# Patient Record
Sex: Female | Born: 1955 | Race: White | Hispanic: No | Marital: Married | Smoking: Former smoker
Health system: Southern US, Community
[De-identification: ages and names within clinical notes are randomized; demographics above are authoritative.]

## PROBLEM LIST (undated history)

## (undated) DIAGNOSIS — I82409 Acute embolism and thrombosis of unspecified deep veins of unspecified lower extremity: Secondary | ICD-10-CM

## (undated) DIAGNOSIS — Z9109 Other allergy status, other than to drugs and biological substances: Secondary | ICD-10-CM

## (undated) DIAGNOSIS — E119 Type 2 diabetes mellitus without complications: Secondary | ICD-10-CM

## (undated) DIAGNOSIS — I10 Essential (primary) hypertension: Secondary | ICD-10-CM

## (undated) DIAGNOSIS — J449 Chronic obstructive pulmonary disease, unspecified: Secondary | ICD-10-CM

## (undated) DIAGNOSIS — Z86718 Personal history of other venous thrombosis and embolism: Secondary | ICD-10-CM

## (undated) HISTORY — DX: Personal history of other venous thrombosis and embolism: Z86.718

## (undated) HISTORY — DX: Chronic obstructive pulmonary disease, unspecified: J44.9

## (undated) HISTORY — DX: Essential (primary) hypertension: I10

## (undated) HISTORY — DX: Type 2 diabetes mellitus without complications: E11.9

## (undated) HISTORY — DX: Acute embolism and thrombosis of unspecified deep veins of unspecified lower extremity: I82.409

## (undated) HISTORY — DX: Other allergy status, other than to drugs and biological substances: Z91.09

---

## 2012-05-25 DIAGNOSIS — I2699 Other pulmonary embolism without acute cor pulmonale: Secondary | ICD-10-CM

## 2012-05-25 HISTORY — DX: Other pulmonary embolism without acute cor pulmonale: I26.99

## 2012-10-24 ENCOUNTER — Institutional Professional Consult (permissible substitution): Payer: Self-pay | Admitting: Critical Care Medicine

## 2013-01-05 ENCOUNTER — Institutional Professional Consult (permissible substitution): Payer: Self-pay | Admitting: Internal Medicine

## 2013-01-08 ENCOUNTER — Telehealth: Payer: Self-pay | Admitting: Internal Medicine

## 2013-01-08 NOTE — Telephone Encounter (Signed)
Katie, pls advise if pt can be worked in.  Thank you.

## 2013-01-08 NOTE — Telephone Encounter (Signed)
Pt can come in on 01-18-13 at 11 am for 11:15am consult with CY.

## 2013-01-08 NOTE — Telephone Encounter (Signed)
Spoke with pt and scheduled appt with CDY for 2/27 at 11 am for 11:15 consult Pt states nothing further needed

## 2013-01-18 ENCOUNTER — Other Ambulatory Visit: Payer: BC Managed Care – PPO

## 2013-01-18 ENCOUNTER — Encounter: Payer: Self-pay | Admitting: Internal Medicine

## 2013-01-18 ENCOUNTER — Ambulatory Visit (INDEPENDENT_AMBULATORY_CARE_PROVIDER_SITE_OTHER)
Admission: RE | Admit: 2013-01-18 | Discharge: 2013-01-18 | Disposition: A | Discharge status: Home / Self Care | Payer: BC Managed Care – PPO | Source: Ambulatory Visit | Attending: Internal Medicine | Admitting: Internal Medicine

## 2013-01-18 ENCOUNTER — Ambulatory Visit (INDEPENDENT_AMBULATORY_CARE_PROVIDER_SITE_OTHER): Payer: BC Managed Care – PPO | Admitting: Internal Medicine

## 2013-01-18 VITALS — BP 122/64 | HR 72 | Ht 66.0 in | Wt 253.2 lb
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
## 2013-01-18 NOTE — Patient Instructions (Addendum)
Order- CXR  Dx  Bronchitis  Order- lab- Allergy profile     Dx allergic rhinitis  We will contact [facility] for a copy of your PFT results from 2013  Sample Dymista nasal spray    1-2 puffs each nostril, once daily at bedtime

## 2013-01-19 DIAGNOSIS — J449 Chronic obstructive pulmonary disease, unspecified: Secondary | ICD-10-CM | POA: Insufficient documentation

## 2013-01-19 DIAGNOSIS — J4489 Other specified chronic obstructive pulmonary disease: Secondary | ICD-10-CM | POA: Insufficient documentation

## 2013-01-19 LAB — ALLERGY FULL PROFILE
Allergen, D pternoyssinus,d7: <0.1 kU/L
Allergen,Goose feathers, e70: <0.1 kU/L
Alternaria Alternata: <0.1 kU/L
Aspergillus fumigatus, m3: <0.1 kU/L
Bahia Grass: <0.1 kU/L
Box Elder IgE: <0.1 kU/L
Cat Dander: <0.1 kU/L
Common Ragweed: <0.1 kU/L
D. farinae: <0.1 kU/L
Dog Dander: <0.1 kU/L
Goldenrod: <0.1 kU/L
Helminthosporium halodes: <0.1 kU/L
House Dust Hollister: <0.1 kU/L
IgE (Immunoglobulin E), Serum: 155 IU/mL (ref 0.0–180.0)
Plantain: <0.1 kU/L
Stemphylium Botryosum: <0.1 kU/L
Sycamore Tree: <0.1 kU/L

## 2013-01-19 NOTE — Assessment & Plan Note (Signed)
Plan-sample Dymista, allergy profile

## 2013-01-22 NOTE — Progress Notes (Signed)
Quick Note:  Pt aware of results. ______ 

## 2013-02-13 ENCOUNTER — Institutional Professional Consult (permissible substitution): Payer: Self-pay | Admitting: Internal Medicine

## 2013-02-20 ENCOUNTER — Ambulatory Visit (INDEPENDENT_AMBULATORY_CARE_PROVIDER_SITE_OTHER): Payer: BC Managed Care – PPO | Admitting: Internal Medicine

## 2013-02-20 ENCOUNTER — Encounter: Payer: Self-pay | Admitting: Internal Medicine

## 2013-02-20 VITALS — BP 120/80 | HR 78 | Ht 63.0 in | Wt 248.2 lb

## 2013-02-20 DIAGNOSIS — J309 Allergic rhinitis, unspecified: Secondary | ICD-10-CM

## 2013-02-20 DIAGNOSIS — J441 Chronic obstructive pulmonary disease with (acute) exacerbation: Secondary | ICD-10-CM

## 2013-02-20 DIAGNOSIS — J302 Other seasonal allergic rhinitis: Secondary | ICD-10-CM

## 2013-02-20 DIAGNOSIS — J449 Chronic obstructive pulmonary disease, unspecified: Secondary | ICD-10-CM

## 2013-02-20 MED ORDER — PHENYLEPHRINE HCL 1 % NA SOLN
3.0000 [drp] | Freq: Once | NASAL | Status: AC
  Administered 2013-02-20: 3 [drp] via NASAL

## 2013-02-20 MED ORDER — METHYLPREDNISOLONE ACETATE 80 MG/ML IJ SUSP
80.0000 mg | Freq: Once | INTRAMUSCULAR | Status: AC
  Administered 2013-02-20: 80 mg via INTRAMUSCULAR

## 2013-02-20 MED ORDER — DESLORATADINE 5 MG PO TABS: 5.0000 mg | ORAL_TABLET | Freq: Every day | ORAL | Status: DC

## 2013-02-20 NOTE — Patient Instructions (Addendum)
Neb neo nasal  Depo 80  Order- schedule PFT  Dx COPD  Try Afrin otc nasal spray  1-2 puffs each nostril twice daily x 3 days only  Sample Omnaris  Nasal spray. Each time you use Afrin, as soon as your nose opens, then use the [facility] to try again with the claritin/loratadine  If Claritin doesn't help, then try script for Clarinex

## 2013-02-20 NOTE — Progress Notes (Signed)
01/18/13- 57 yo F former 2 ppd smoker referred courtesy of  Conroy(Nathan)-cough  Here with friend. She was told in 2013 that she had COPD based on PFT at [facility]. Some history of seasonal allergic rhinitis. [place] with animal and hay exposure, pet cats. She complains of burning throat and cough for the past year intermittently. Only occasionally coughs up some clear phlegm. No specific trigger, location or exposure. Generic Claritin helped for one year and then stopped helping. Other antihistamines did not help. Inhalers no help. Mainly throat discomfort with postnasal drip. Few lower respiratory complaints. Advair helped for a while to reduce wheeze but then seemed to stop working. GERD controlled with Nexium. Daughter has asthma and father had asthma.  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 Dymista nasal spray no help, tried on 2 different occasions. Claritin used to work when she switched to [employer] and Allegra-D. She has not tried Claritin again but thinks he worked better. 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 PFT at [facility] 09/20/12- moderate obstructive airways disease. Insignificant response to bronchodilator. FVC 3.19/73%, FEV1 2.59/57%, FEV1/FVC 0.80, FEF 25-75% 2.69/29%. Measured lung volumes and diffusion were not done. She says she was sick that day. CXR 01/22/13 IMPRESSION:  No active cardiopulmonary disease.  Original Report Authenticated By: Charlett Nose, M.D.  ROS-see HPI Constitutional:   No-   weight loss, night sweats, fevers, chills, fatigue, lassitude. HEENT:   No-  headaches, difficulty swallowing, tooth/dental problems, sore throat,       +sneezing, itching, ear ache, nasal congestion, post nasal drip,  CV:  No-   chest pain, orthopnea, PND, swelling in lower extremities, anasarca,                                  dizziness,  palpitations Resp: +  shortness of breath with exertion or at rest.              +  productive cough,  + non-productive cough,  No- coughing up of blood.              No-   change in color of mucus.  + wheezing.   Skin: No-   rash or lesions. GI:  No-   heartburn, indigestion, abdominal pain, nausea, vomiting, GU:  MS:  No-   joint pain or swelling.   Neuro-     nothing unusual Psych:  No- change in mood or affect. No depression or anxiety.  No memory loss.  OBJ- Physical Exam General- Alert, Oriented, Affect-appropriate, Distress- none acute. Obese Skin- rash-none, lesions- none, excoriation- none Lymphadenopathy- question small anterior cervical nodes Head- atraumatic            Eyes- Gross vision intact, PERRLA, conjunctivae and secretions clear            Ears- Hearing, canals-normal            Nose- + obvious nasal congestion+turbinate edema, no-Septal dev, mucus, polyps, erosion, perforation             Throat- Mallampati II , mucosa clear , drainage- none, tonsils- atrophic Neck- flexible , trachea midline, no stridor , thyroid nl, carotid no bruit Chest - symmetrical excursion , unlabored           Heart/CV-  RRR , no murmur , no gallop  , no rub, nl s1 s2                           - JVD- none , edema-+ 1/ L>R, stasis changes- none, varices- none           Lung- clear to P&A, wheeze- none, cough- none , dullness-none, rub- none           Chest wall-  Abd-  Br/ Gen/ Rectal- Not done, not indicated Extrem- cyanosis- none, clubbing, none, atrophy- none, strength- nl Neuro- grossly intact to observation

## 2013-02-28 NOTE — Assessment & Plan Note (Signed)
She indicates the PFTs in [facility] may not have been representative but she has a significant remote smoking history. Plan-update PFT to include measured lung volumes and diffusion capacity.

## 2013-02-28 NOTE — Assessment & Plan Note (Addendum)
Rhinitis is the most obvious finding on physical exam at this visit. Allergic and/or vasomotor. Plan-for 3 days only, to use Afrin nasal spray once or twice daily,start sample of Omnaris nasal spray, retry Claritin but given comparison prescription for Clarinex at her request.

## 2013-04-12 ENCOUNTER — Ambulatory Visit (INDEPENDENT_AMBULATORY_CARE_PROVIDER_SITE_OTHER): Payer: BC Managed Care – PPO | Admitting: Internal Medicine

## 2013-04-12 ENCOUNTER — Encounter: Payer: Self-pay | Admitting: Internal Medicine

## 2013-04-12 VITALS — BP 120/80 | HR 80 | Ht 64.0 in | Wt 242.0 lb

## 2013-04-12 DIAGNOSIS — J302 Other seasonal allergic rhinitis: Secondary | ICD-10-CM

## 2013-04-12 DIAGNOSIS — J449 Chronic obstructive pulmonary disease, unspecified: Secondary | ICD-10-CM

## 2013-04-12 DIAGNOSIS — R0602 Shortness of breath: Secondary | ICD-10-CM

## 2013-04-12 DIAGNOSIS — H6980 Other specified disorders of Eustachian tube, unspecified ear: Secondary | ICD-10-CM

## 2013-04-12 DIAGNOSIS — J3089 Other allergic rhinitis: Secondary | ICD-10-CM

## 2013-04-12 DIAGNOSIS — J309 Allergic rhinitis, unspecified: Secondary | ICD-10-CM

## 2013-04-12 DIAGNOSIS — J029 Acute pharyngitis, unspecified: Secondary | ICD-10-CM

## 2013-04-12 DIAGNOSIS — J4489 Other specified chronic obstructive pulmonary disease: Secondary | ICD-10-CM

## 2013-04-12 DIAGNOSIS — H6990 Unspecified Eustachian tube disorder, unspecified ear: Secondary | ICD-10-CM

## 2013-04-12 LAB — PULMONARY FUNCTION TEST

## 2013-04-12 NOTE — Progress Notes (Signed)
PFT done today. 

## 2013-04-12 NOTE — Progress Notes (Signed)
01/18/13- 57 yo F former 2 ppd smoker referred courtesy of  Conroy(Nathan)-cough  Here with friend. She was told in 2013 that she had COPD based on PFT at [facility]. Some history of seasonal allergic rhinitis. [place] with animal and hay exposure, pet cats. She complains of burning throat and cough for the past year intermittently. Only occasionally coughs up some clear phlegm. No specific trigger, location or exposure. Generic Claritin helped for one year and then stopped helping. Other antihistamines did not help. Inhalers no help. Mainly throat discomfort with postnasal drip. Few lower respiratory complaints. Advair helped for a while to reduce wheeze but then seemed to stop working. GERD controlled with Nexium. Daughter has asthma and father had asthma.  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 Dymista nasal spray no help, tried on 2 different occasions. Claritin used to work when she switched to [employer] and Allegra-D. She has not tried Claritin again but thinks he worked better. 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 PFT at [facility] 09/20/12- moderate obstructive airways disease. Insignificant response to bronchodilator. FVC 3.19/73%, FEV1 2.59/57%, FEV1/FVC 0.80, FEF 25-75% 2.69/29%. Measured lung volumes and diffusion were not done. She says she was sick that day. CXR 01/22/13 IMPRESSION:  No active cardiopulmonary disease.  Original Report Authenticated By: Charlett Nose, M.D.  04/12/13- 59 yo F former 2 ppd smoker referred courtesy of  Lonie Peak -cough   Sister here FOLLOWS FOR: review PFt with patient. Loratadine has not helped. On Aricept sample did not help. Depo-Medrol was no help for her rhinitis. Nose feels clear today Denies heartburn or acid indigestion on Nexium. She does complain that throat burns. Cough starts with a burning sensation in  the area of her left tonsil, after which she coughs out a plug from her tonsil. No cough unless her throat stings.. Has not used Advair in a long time. Remains on propranolol but taken off of lisinopril. PFT: 04/12/2013-moderate obstructive airways disease with response to bronchodilator, normal lung volumes, diffusion mildly reduced. FVC 2.7 tooth/80%, FEV1 1.86/70%, FEV1/FVC 0.68/86%,  FEF 25-75% 1.39/55%. TLC 99%, DLCO 69%.  ROS-see HPI Constitutional:   No-   weight loss, night sweats, fevers, chills, fatigue, lassitude. HEENT:   No-  headaches, difficulty swallowing, tooth/dental problems, +sore throat,       +sneezing, itching, ear ache, nasal congestion, post nasal drip,  CV:  No-   chest pain, orthopnea, PND, swelling in lower extremities, anasarca,                                  dizziness, palpitations Resp: +  shortness of breath with exertion or at rest.              +  productive cough,  + non-productive cough,  No- coughing up of blood.              No-   change in color of mucus.  + wheezing.   Skin: No-   rash or lesions. GI:  No-   heartburn, indigestion, abdominal pain, nausea, vomiting, GU:  MS:  No-   joint pain or swelling.   Neuro-     nothing unusual Psych:  No- change in mood or affect. No depression or anxiety.  No memory loss.  OBJ- Physical Exam General- Alert, Oriented, Affect-appropriate, Distress- none acute. Obese Skin- rash-none, lesions- none, excoriation- none Lymphadenopathy- question small anterior cervical nodes Head- atraumatic            Eyes- Gross vision intact, PERRLA, conjunctivae and secretions clear            Ears- Hearing, canals-normal            Nose- + obvious nasal congestion+turbinate edema, no-Septal dev, mucus, polyps, erosion, perforation             Throat- Mallampati IIIof , mucosa clear , drainage- none, tonsils- atrophic Neck- flexible , trachea midline, no stridor , thyroid nl, carotid no bruit Chest - symmetrical excursion ,  unlabored           Heart/CV- RRR , no murmur , no gallop  , no rub, nl s1 s2                           - JVD- none , edema-+ 1/ L>R, stasis changes- none, varices- none           Lung- clear to P&A, wheeze- none, cough- none , dullness-none, rub- none           Chest wall-  Abd-  Br/ Gen/ Rectal- Not done, not indicated Extrem- cyanosis- none, clubbing, none, atrophy- none, strength- nl Neuro- grossly intact to observation

## 2013-04-12 NOTE — Patient Instructions (Addendum)
Refer to ENT Dr Haroldine Laws      Dx eustachian dysfunction, pharyngitis

## 2013-04-24 NOTE — Assessment & Plan Note (Signed)
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 We can consider a trial of maintenance bronchodilator later.

## 2013-04-24 NOTE — Assessment & Plan Note (Signed)
Recurrent eustachian dysfunction. Coughing retained  plugs from tonsil crypts. Plan-I offered ENT referral for consideration of her eustachian dysfunction and for complaints about her tonsils

## 2013-04-30 ENCOUNTER — Encounter: Payer: Self-pay | Admitting: Internal Medicine

## 2013-05-03 ENCOUNTER — Telehealth: Payer: Self-pay | Admitting: Internal Medicine

## 2013-05-03 NOTE — Telephone Encounter (Signed)
Per CY-we can manage her allergy problems; If she would like an opinion from elsewhere that is always fine.

## 2013-05-03 NOTE — Telephone Encounter (Signed)
I spoke with pt and appt scheduled. Nothing further was needed 

## 2013-05-03 NOTE — Telephone Encounter (Signed)
Pt aware and was transferred upfront to schedule an appt. Nothing further was needed

## 2013-05-03 NOTE — Telephone Encounter (Signed)
Friday 05-18-13 at 11:00am. Thanks

## 2013-05-03 NOTE — Telephone Encounter (Signed)
I spoke with pt. She stated she saw Dr. Haroldine Randall as Dr. Maple Randall referred her to him. She stated she had CT sinus scan and it was fine. ENT referred her to an allergists and she is confused bc Dr. Maple Randall is an allergists. Dr. Haroldine Randall referred her to [facility] for possible allergies/allergy testing. She is wanting to know how Dr. Maple Randall feels about this. Please advise thanks  No pending appt.

## 2013-05-03 NOTE — Telephone Encounter (Signed)
Called spoke with patient, verified message above.   Pt requesting to be seen within the next 2-3 weeks CY with no openings - pt does not want to see TP Katie/CY please advise of work-in appt, thank you.

## 2013-05-03 NOTE — Telephone Encounter (Signed)
(  continued) ...replied, "No".  Pt would like to know why she was referred to ENT when the ENT is advising that pt needs to see an allergy specialist (referred pt to an allergist).  Pt is confused and asks to speak w/ CY's nurse.  Antionette Fairy

## 2013-05-18 ENCOUNTER — Ambulatory Visit: Payer: BC Managed Care – PPO | Admitting: Internal Medicine

## 2013-12-03 ENCOUNTER — Telehealth: Payer: Self-pay | Admitting: Internal Medicine

## 2013-12-03 NOTE — Telephone Encounter (Signed)
Pt states that she will not be making any future appts w/ CY at this time.  Antionette FairyHolly D Pryor

## 2016-11-05 ENCOUNTER — Other Ambulatory Visit: Payer: Self-pay | Admitting: Physician Assistant

## 2016-11-05 DIAGNOSIS — M5416 Radiculopathy, lumbar region: Secondary | ICD-10-CM

## 2016-11-16 ENCOUNTER — Ambulatory Visit
Admission: RE | Admit: 2016-11-16 | Discharge: 2016-11-16 | Disposition: A | Discharge status: Home / Self Care | Payer: BLUE CROSS/BLUE SHIELD | Source: Ambulatory Visit | Attending: Physician Assistant | Admitting: Physician Assistant

## 2016-11-16 DIAGNOSIS — M5416 Radiculopathy, lumbar region: Secondary | ICD-10-CM

## 2018-08-05 IMAGING — MR MR LUMBAR SPINE W/O CM
4 of 5 series · 18 of 48 positions shown · non-contrast
Comparison: Radiographs 10/09/2016.

CLINICAL DATA: Severe left buttock and posterior left leg pain for
3 months. No known injury or prior relevant surgery.

EXAM:
MRI LUMBAR SPINE WITHOUT CONTRAST
TECHNIQUE: Multiplanar, multisequence MR imaging of the lumbar spine was
performed. No intravenous contrast was administered.

[Series 6: T2 · sagittal · 4.0mm · 0.68mm/px · 6 of 15 slices shown (1 of 2)]
[im 1/15]
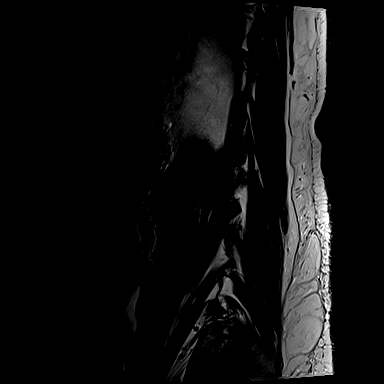
[im 3/15]
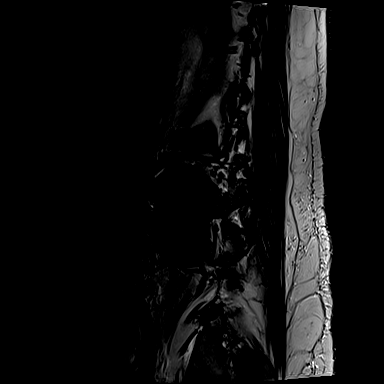
[im 6/15]
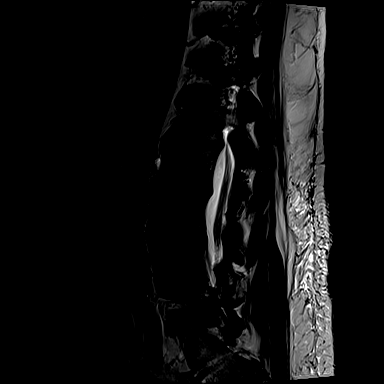
[im 9/15]
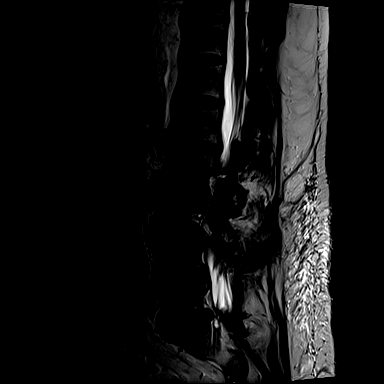
[im 12/15]
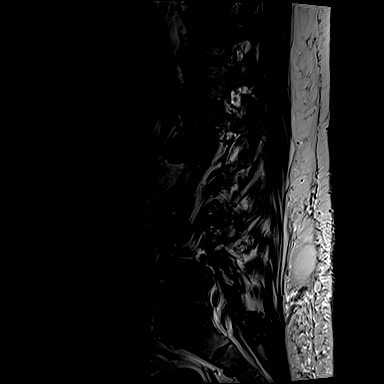
[im 15/15]
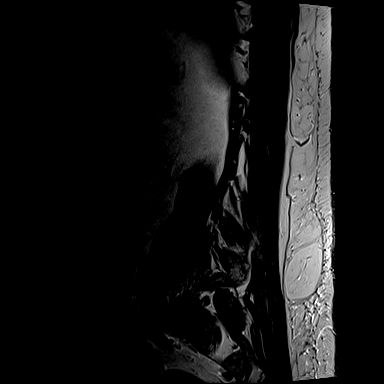

[Series 7: T1 · sagittal · 4.0mm · 0.68mm/px · 3 of 15 slices shown (1 of 2)]
[im 3/15]
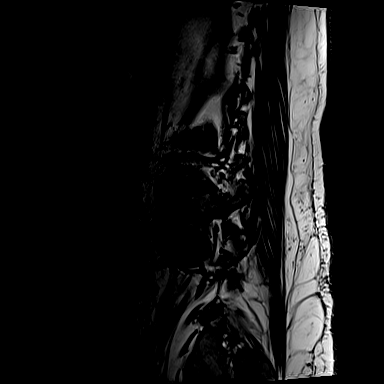
[im 9/15]
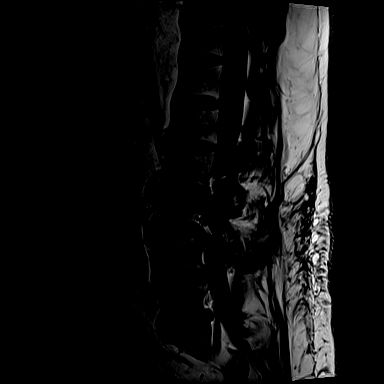
[im 15/15]
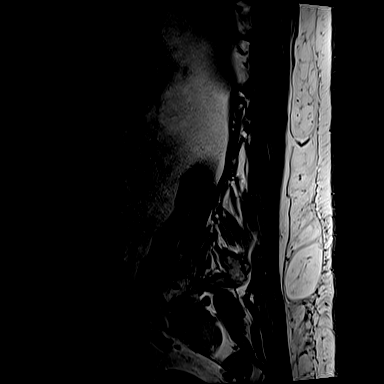

[Series 11: T1 · axial · 4.0mm · 0.28mm/px · z∈[-103,+35]mm · 3 of 35 slices shown (2 of 2)]
[im 5/35]
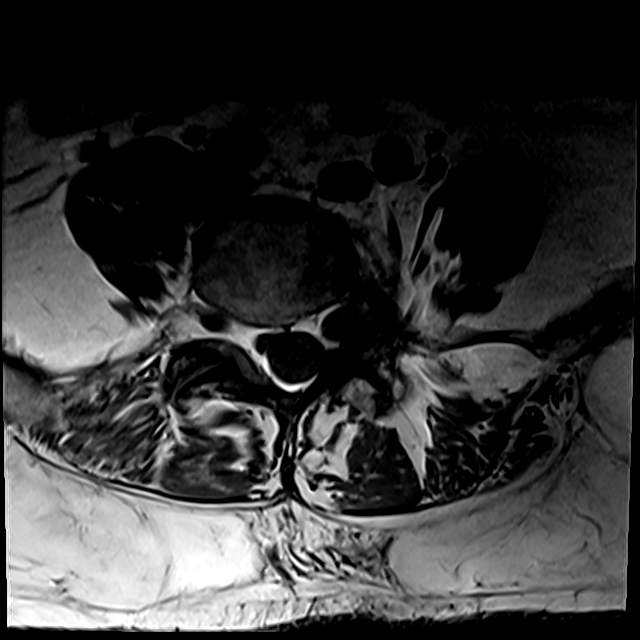
[im 18/35]
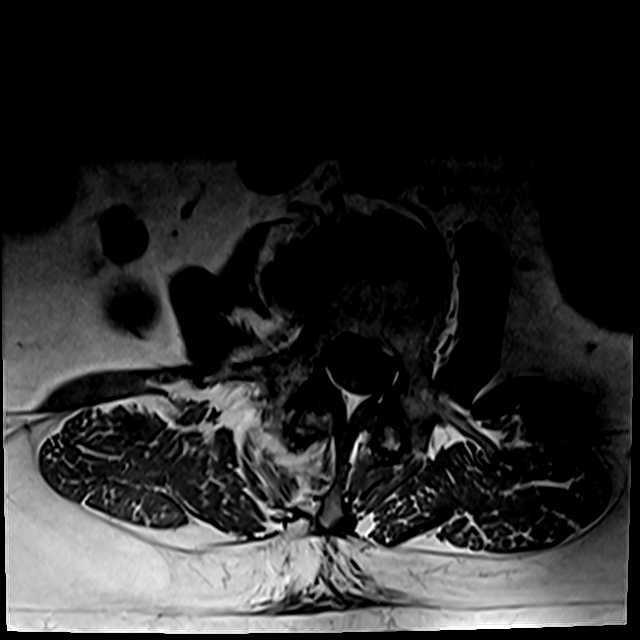
[im 30/35]
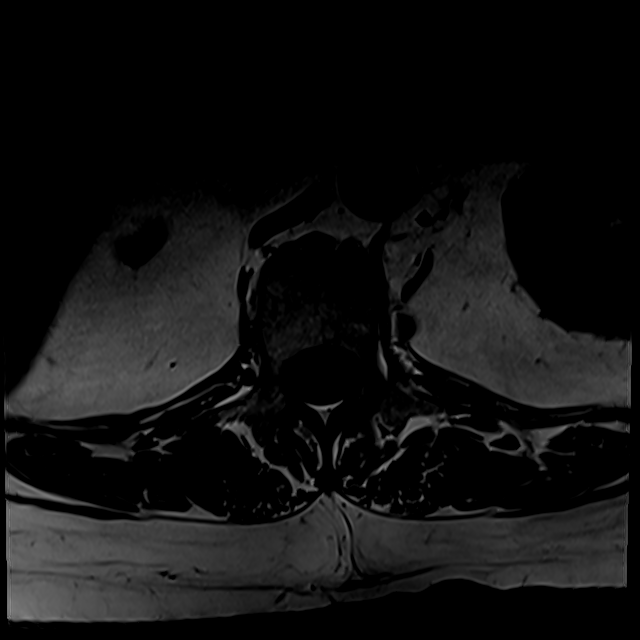

[Series 14: T2 · axial · 4.0mm · 0.28mm/px · z∈[-123,+35]mm · 6 of 35 slices shown (2 of 2)]
[im 1/35]
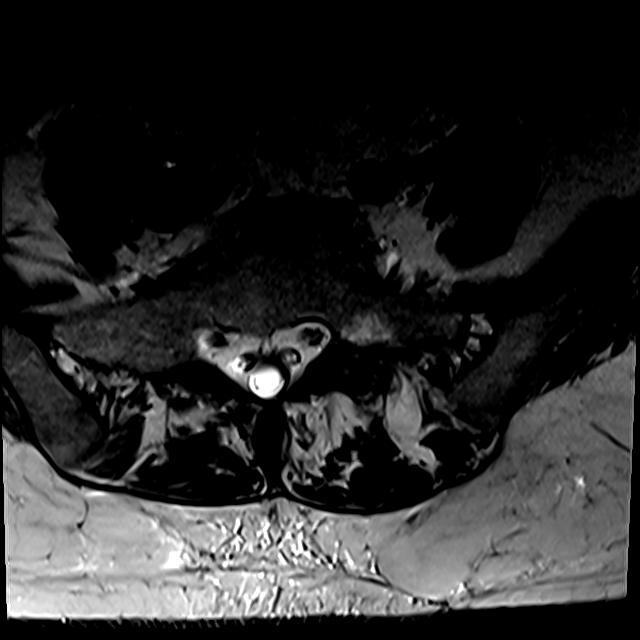
[im 5/35]
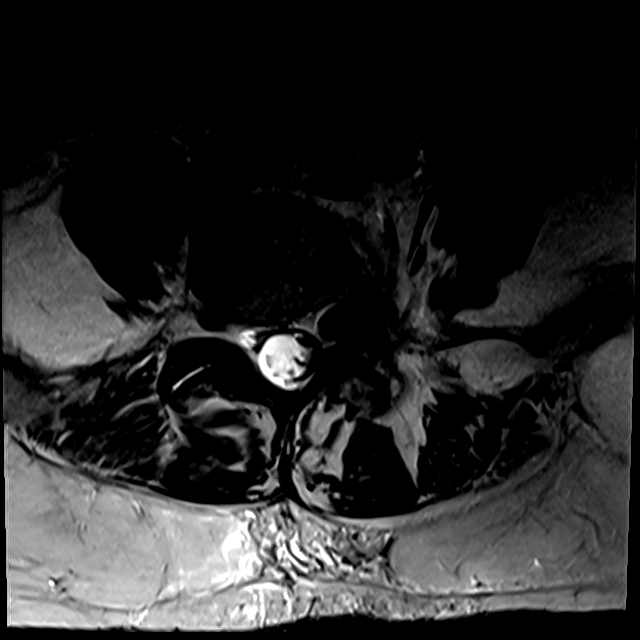
[im 10/35]
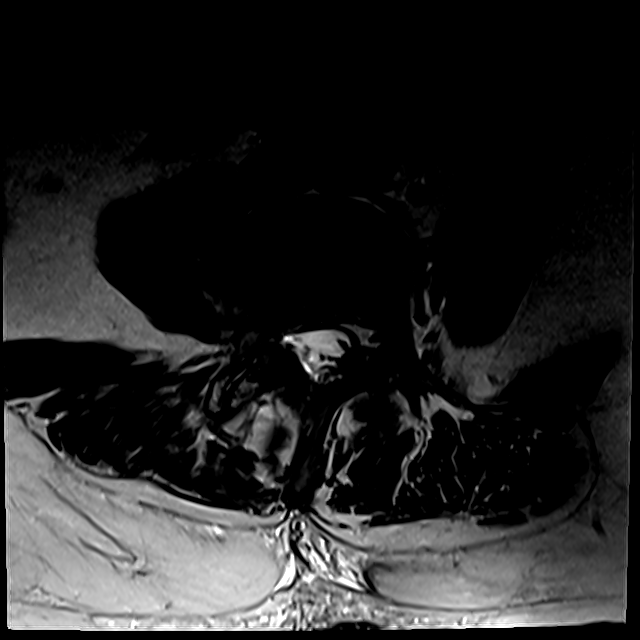
[im 15/35]
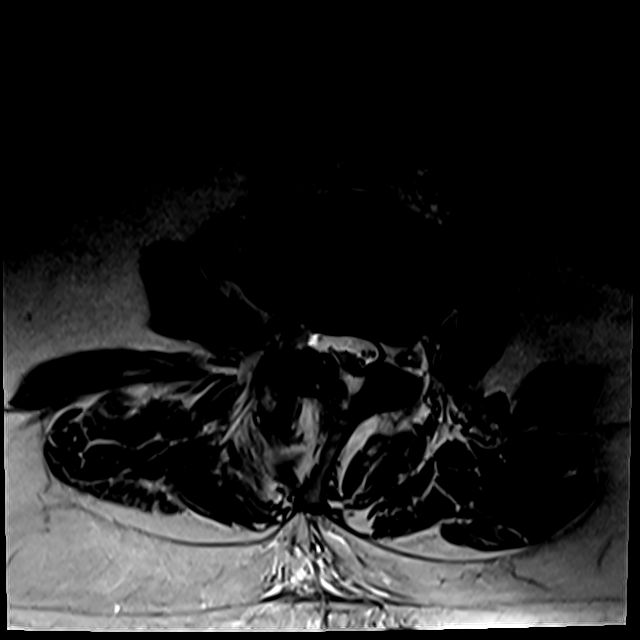
[im 18/35]
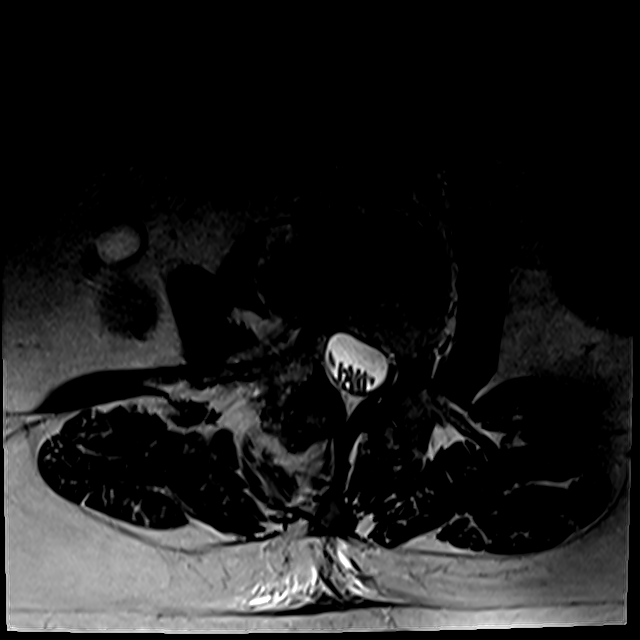
[im 30/35]
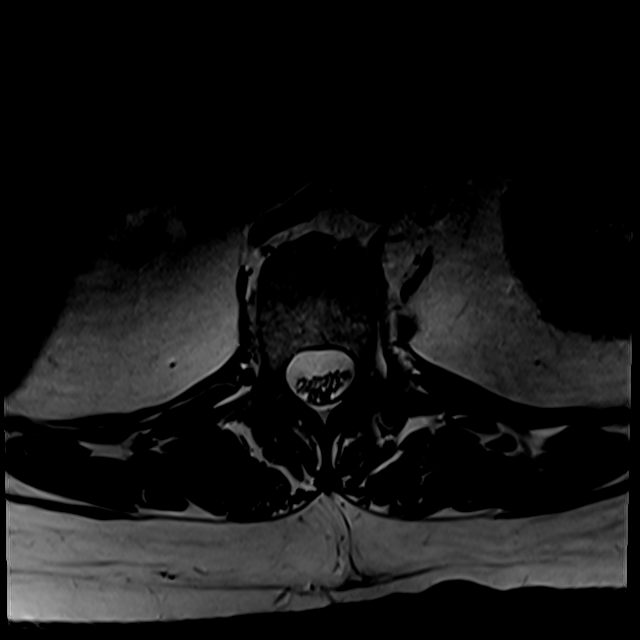

[18 of 48 positions shown; findings below may reference images not displayed]

FINDINGS: Segmentation: Conventional anatomy assumed, with the last open disc
space designated L5-S1.

Alignment: Grossly stable moderate convex left scoliosis centered at
L3. The lateral alignment is near anatomic.

Vertebrae: No worrisome osseous lesion, acute fracture or pars
defect.

Conus medullaris: Extends to the T12 level and appears normal.

Paraspinal and other soft tissues: No significant paraspinal
findings. The right kidney is markedly atrophied with multiple
cystic lesions, possibly a multi-cystic dysplastic kidney. The
visualized left kidney appears unremarkable.

Disc levels:

No significant disc space findings from T10-11 through T12-L1.

L1-2: Annular disc bulging and endplate osteophytes asymmetric to
the left. No significant spinal stenosis or nerve root encroachment.

L2-3: Annular disc bulging and endplate osteophytes asymmetric to
the right. There is asymmetric right-sided facet hypertrophy,
contributing to narrowing of the right lateral recess and possible
right L3 nerve root encroachment. There is no foraminal compromise.

L3-4: Annular disc bulging with moderate facet hypertrophy, worse on
the right. Resulting minimal anterolisthesis and borderline spinal
stenosis. No foraminal compromise or nerve root encroachment.

L4-5: Annular disc bulging and moderate facet hypertrophy, worse on
the right. Resulting minimal anterolisthesis and mild right
foraminal narrowing. No definite nerve root encroachment.

L5-S1: There is a broad-based left foraminal disc osteophyte complex
with moderate bilateral facet hypertrophy. These factors contribute
to severe left foraminal narrowing and probable left L5 nerve root
encroachment. The left lateral recess is mildly narrowed without S1
nerve root encroachment.
IMPRESSION: 1. Severe left foraminal narrowing at L5-S1 with probable left L5
nerve root encroachment by a disc osteophyte complex.
2. No other evidence of left-sided nerve root encroachment. Disc
bulging, facet hypertrophy and scoliosis contribute to mild right
foraminal narrowing at L4-5. There is mild narrowing of the right
lateral recess at L2-3.
3. Marked atrophy and cyst formation within the right kidney,
possibly a multi-cystic dysplastic kidney.

## 2019-08-23 ENCOUNTER — Other Ambulatory Visit: Payer: Self-pay

## 2019-08-23 DIAGNOSIS — R600 Localized edema: Secondary | ICD-10-CM

## 2019-08-29 ENCOUNTER — Ambulatory Visit (HOSPITAL_COMMUNITY)
Admission: RE | Admit: 2019-08-29 | Discharge: 2019-08-29 | Disposition: A | Discharge status: Home / Self Care | Payer: 59 | Source: Ambulatory Visit | Attending: Family | Admitting: Family

## 2019-08-29 ENCOUNTER — Ambulatory Visit (INDEPENDENT_AMBULATORY_CARE_PROVIDER_SITE_OTHER): Payer: 59 | Admitting: Family

## 2019-08-29 ENCOUNTER — Other Ambulatory Visit: Payer: Self-pay

## 2019-08-29 ENCOUNTER — Encounter: Payer: Self-pay | Admitting: Family

## 2019-08-29 VITALS — BP 98/57 | HR 70 | Temp 97.7°F | Resp 20 | Ht 64.0 in | Wt 230.6 lb

## 2019-08-29 DIAGNOSIS — Z86711 Personal history of pulmonary embolism: Secondary | ICD-10-CM

## 2019-08-29 DIAGNOSIS — Z87891 Personal history of nicotine dependence: Secondary | ICD-10-CM

## 2019-08-29 DIAGNOSIS — R600 Localized edema: Secondary | ICD-10-CM | POA: Diagnosis present

## 2019-08-29 DIAGNOSIS — Z86718 Personal history of other venous thrombosis and embolism: Secondary | ICD-10-CM | POA: Diagnosis not present

## 2019-08-29 DIAGNOSIS — I83893 Varicose veins of bilateral lower extremities with other complications: Secondary | ICD-10-CM | POA: Diagnosis not present

## 2019-08-29 NOTE — Progress Notes (Signed)
Referred by:  Lonie Peak, PA-C [address] [place],  [place] [address]  Reason for referral: Swelling, aching, and varicose veins in legs  History of Present Illness  Joanne Randall is a 62 y.o. (04-07-1956) female who presents with chief complaint: swelling and varicose veins in both legs x 8-10 months, is worsening, worse in her left leg. Her legs ache at times, worse with sitting, not as bad with standing.  She reports intermittent sharp pain in left foot x 5-6 months. 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  There is family history of venous disorders, her mother had varicose veins.  The patient has used knee high compression stockings in the past, felt supportive initially, then felt too tight.  She states that her last A1C was 6.5, has DM.  She quit tobacco use in 1991, states she has COPD.   Past Medical History:  Diagnosis Date  . COPD (chronic obstructive pulmonary disease) (HCC)   . Diabetes mellitus without complication (HCC)   . DVT (deep venous thrombosis) (HCC)   . Environmental allergies   . H/O blood clots   . Hypertension     No past surgical history on file.  Social History   Socioeconomic History  . Marital status: Married    Spouse name: Not on file  . Number of children: 1  . Years of education: Not on file  . Highest education level: Not on file  Occupational History  . Occupation: Human resources officer, former Catering manager  Social Needs  . Financial resource strain: Not on file  . Food insecurity    Worry: Not on file    Inability: Not on file  . Transportation needs    Medical: Not on file    Non-medical: Not on file  Tobacco Use  . Smoking status: Former Smoker    Packs/day: 2.00    Years: 21.00    Pack years: 42.00    Types:  Cigarettes    Quit date: 11/22/1989    Years since quitting: 29.7  . Smokeless tobacco: Never Used  Substance and Sexual Activity  . Alcohol use: No  . Drug use: No  . Sexual activity: Not on file  Lifestyle  . Physical activity    Days per week: Not on file    Minutes per session: Not on file  . Stress: Not on file  Relationships  . Social Musician on phone: Not on file    Gets together: Not on file    Attends religious service: Not on file    Active member of club or organization: Not on file    Attends meetings of clubs or organizations: Not on file    Relationship status: Not on file  . Intimate partner violence    Fear of current or ex partner: Not on file    Emotionally abused: Not on file    Physically abused: Not on file    Forced sexual activity: Not on file  Other Topics Concern  . Not on file  Social History Narrative  . Not on file    Family History  Problem Relation Age of Onset  . Allergies Mother        smoker  . Asthma Father  smoker  . Emphysema Father   . Allergies Daughter   . Allergies Maternal Grandmother   . Rheum arthritis Maternal Grandmother   . Rheum arthritis Maternal Grandfather   . Cancer Other        Maternal great grandmother    Current Outpatient Medications on File Prior to Visit  Medication Sig Dispense Refill  . albuterol (PROVENTIL) (2.5 MG/3ML) 0.083% nebulizer solution 1 vial QID prn    . cephALEXin (KEFLEX) 500 MG capsule Take 500 mg by mouth 2 (two) times daily.    . dabigatran (PRADAXA) 150 MG CAPS capsule TAKE 1 CAPSULE BY MOUTH TWICE DAILY FOR BLOOD CLOT PREVENTION    . glipiZIDE (GLUCOTROL XL) 5 MG 24 hr tablet TAKE 1 TABLET BY MOUTH IN THE MORNING WITH FOOD    . losartan (COZAAR) 25 MG tablet Take 25 mg by mouth daily.    . pantoprazole (PROTONIX) 40 MG tablet Take 40 mg by mouth daily.    . Potassium 99 MG TABS Take 1 tablet by mouth daily.    . propranolol (INDERAL) 20 MG tablet Take 2 tablets by  mouth 2 (two) times daily.    Marland Kitchen sAXagliptin HCl (ONGLYZA PO) Take by mouth.    . triamcinolone cream (KENALOG) 0.1 % Apply 1 application topically 2 (two) times daily.    . Turmeric 500 MG CAPS Take 1 capsule by mouth 2 (two) times daily.    Marland Kitchen ZOLOFT 50 MG tablet Take 25 mg by mouth daily.      No current facility-administered medications on file prior to visit.     Allergies  Allergen Reactions  . Aspirin     GI upset/Burns    REVIEW OF SYSTEMS: Cardiovascular: No chest pain, chest pressure, palpitations, orthopnea, + dyspnea on exertion that she attributes to COPD. No claudication or rest pain,  + of DVT and phlebitis. Pulmonary: No productive cough, asthma, + occasional wheezing. Neurologic: No weakness, paresthesias, aphasia, or amaurosis. No dizziness. Hematologic: No bleeding problems or clotting disorders. Musculoskeletal: + joint pain or joint swelling in hips, back, and toes, states she has sciatica. Previous back injury Gastrointestinal: No blood in stool or hematemesis Genitourinary: No dysuria or hematuria. Psychiatric:: No history of major depression. Integumentary: No rashes or ulcers. "itchy flaky scalp", taking Keflex for this, seeing a dermatologist Constitutional: No fever or chills.  Physical Examination Vitals:   08/29/19 1444  BP: (!) 98/57  Pulse: 70  Resp: 20  Temp: 97.7 F (36.5 C)  SpO2: 95%  Weight: 230 lb 9.6 oz (104.6 kg)  Height: 5\' 4"  (1.626 m)   Body mass index is 39.58 kg/m.  PHYSICAL EXAMINATION: General: Obese female in NAD HEENT:  No gross abnormalities. Large neck Pulmonary: Respirations are non-labored Abdomen: Soft and non-tender with normal bowel sounds. Musculoskeletal: There are no major deformities.   Neurologic: No focal weakness or paresthesias are detected, muscle strength in upper extremities is 5/5, is 4/5 in lower extremities. Leg raise against resistance, of both legs, elicits low back and bilateral hip pain.   Skin:  There are no ulcer or rashes noted. Hemosiderin deposits in left lower leg Psychiatric: The patient has normal affect. Cardiovascular: There is a regular rate and rhythm without significant murmur appreciated.   Vascular: Vessel Right Left  Radial 2+Palpable 2+Palpable  Carotid Palpable, without bruit Palpable, without bruit  Aorta Not palpable N/A  Femoral 2+Palpable 2+Palpable  Popliteal Not palpable Not palpable  PT Not Palpable Not Palpable  DP Not Palpable Not  Palpable   Non-Invasive Vascular Imaging  BLE Venous Duplex (Date: 08/29/2019):  +-----+---------------+---------+-----------+----------+--------------+ RIGHTCompressibilityPhasicitySpontaneityPropertiesThrombus Aging +-----+---------------+---------+-----------+----------+--------------+ POP  Partial                            retracted Chronic        +-----+---------------+---------+-----------+----------+--------------+  Venous Reflux Times Normal value < 0.5 sec +------------------------------+----------+---------+                               Right (ms)Left (ms) +------------------------------+----------+---------+ CFV                           1628.00   3059.00   +------------------------------+----------+---------+ FV                                      1680.00   +------------------------------+----------+---------+ Popliteal                     1768.00   1130.00   +------------------------------+----------+---------+ GSV at Saphenofemoral junction689.00    675.00    +------------------------------+----------+---------+ GSV prox thigh                733.00    1042.00   +------------------------------+----------+---------+ GSV mid thigh                           1056.00   +------------------------------+----------+---------+ GSV dist thigh                          932.00    +------------------------------+----------+---------+ GSV at knee                    660.00    1027.00   +------------------------------+----------+---------+ SSV prox                                2193.00   +------------------------------+----------+---------+ SSV mid                                 2208.00   +------------------------------+----------+---------+  +------------------------------+----------+---------+ VEIN DIAMETERS:               Right (cm)Left (cm) +------------------------------+----------+---------+ GSV at Saphenofemoral junction0.766     1.28      +------------------------------+----------+---------+ GSV at prox thigh             0.620     0.876     +------------------------------+----------+---------+ GSV at mid thigh              0.621     1.02      +------------------------------+----------+---------+ GSV at distal thigh           0.480     0.787     +------------------------------+----------+---------+ GSV at knee                   0.468     0.711     +------------------------------+----------+---------+ GSV prox calf                 0.656     0.775     +------------------------------+----------+---------+ GSV mid calf  0.602     0.903     +------------------------------+----------+---------+ SSV origin                    0.351     0.603     +------------------------------+----------+---------+ SSV prox                      0.393     0.548     +------------------------------+----------+---------+ SSV mid                       0.398     0.529     +------------------------------+----------+---------+  Right Reflux Technical Findings: No evidence of acute DVT, SVT, or Baker's cyst. Chronic , partially compressible recanalized thrombus seen in the popliteal vein. The sapheno-femoral junction is incompetent. The GSV demonstrates reflux at the [facility], proximal thigh and at the knee. No evidence of SSV reflux. The common femoral and popliteal veins demonstrate reflux. Left  Reflux Technical Findings: No evidence of DVT, SVT, or Baker's cyst. The sapheno-femoral junction is incompetent. The GSV demonstrates reflux from the SFJ throughout the vessel. The SSV demonstrates reflux throughout the vessel. Deep venous reflux demonstrated in the common femoral, femoral, and popliteal veins.   Summary: Right: No reflux was noted in the femoral vein in the thigh, great saphenous vein at the mid thigh, great saphenous vein at the distal thigh, proximal small saphenous vein, and mid small saphenous vein. Abnormal reflux times were noted in the common  femoral vein, popliteal vein, great saphenous vein at the saphenofemoral junction, great saphenous vein at the proximal thigh, and great saphenous vein at the knee. Evidence of chronic venous insufficiency is detected in the great saphenous vein, and  deep venous system. Findings consistent with chronic deep vein thrombosis involving the right popliteal vein. There is no evidence of superficial venous thrombosis. No cystic structure found in the popliteal fossa. Left: Abnormal reflux times were noted in the common femoral vein, femoral vein in the thigh, popliteal vein, great saphenous vein at the saphenofemoral junction, great saphenous vein at the proximal thigh, great saphenous vein at the mid thigh, great  saphenous vein at the distal thigh, great saphenous vein at the knee, prox small saphenous vein, and mid small saphenous vein. Evidence of chronic venous insufficiency is detected in the deep venous system, small saphenous vein, and great saphenous vein.  There is no evidence of deep vein thrombosis in the lower extremity. There is no evidence of superficial venous thrombosis. No cystic structure found in the popliteal fossa.    Medical Decision Making  Gracynn Rajewski is a 63 y.o. female who presents with trace non pitting edema in both lower legs, varicoties in both legs, left more so than right, and hemosiderin deposits in  left lower leg. She reports aching in both lower legs at times.  She has a hx of DVT and PE, takes Pradaxa.   Pedal pulses are not palpable, does not seem to walk enough to elicit claudication due to dyspnea secondary to COPD. No signs of ischemia in her feet or legs. Will obtain ABI's on her return in 3 months.   Based on the patient's history and examination, I recommend: follow up in 3 months with ABI's, see Dr. Darrick Penna or Dr. Edilia Bo to discuss varicose vein treatment.  I discussed with the patient the use of her 20-30 mm Hg thigh high compression stockings and need for 3 month trial of such.  The  patient will follow up in 3 months with  Dr. Oneida Alar or Dr. Scot Dock in the [facility] for evaluation for: longer term treatment of varicose veins.  Thank you for allowing [place] to participate in this patient's care.  Clemon Chambers, RN, MSN, FNP-C [facility] Office: [phone]  08/29/2019, 2:51 PM  Clinic MD: Oneida Alar

## 2019-08-29 NOTE — Patient Instructions (Signed)
  To decrease swelling in your feet and legs: Elevate feet above slightly bent knees, feet above heart, overnight and 3-4 times per day for 20 minutes.   

## 2019-12-11 ENCOUNTER — Telehealth (HOSPITAL_COMMUNITY): Payer: Self-pay

## 2019-12-11 NOTE — Telephone Encounter (Signed)

## 2019-12-11 NOTE — Telephone Encounter (Signed)
Called patient to confirm appointment, she refuse to have ultrasound. She wants to see Dr.Fields before proceeding with anything else first.   Joanne Randall

## 2019-12-12 ENCOUNTER — Encounter (HOSPITAL_COMMUNITY): Payer: 59

## 2019-12-12 ENCOUNTER — Ambulatory Visit: Payer: 59 | Admitting: Vascular Surgery

## 2019-12-26 ENCOUNTER — Encounter: Payer: Self-pay | Admitting: Surgery

## 2021-03-05 ENCOUNTER — Encounter: Payer: Self-pay | Admitting: Pulmonary Disease

## 2021-03-05 ENCOUNTER — Ambulatory Visit: Payer: 59 | Admitting: Pulmonary Disease

## 2021-03-05 ENCOUNTER — Other Ambulatory Visit: Payer: Self-pay

## 2021-03-05 VITALS — BP 112/62 | HR 72 | Temp 97.0°F | Ht 62.5 in | Wt 239.2 lb

## 2021-03-05 DIAGNOSIS — R0602 Shortness of breath: Secondary | ICD-10-CM | POA: Diagnosis not present

## 2021-03-05 DIAGNOSIS — J449 Chronic obstructive pulmonary disease, unspecified: Secondary | ICD-10-CM | POA: Diagnosis not present

## 2021-03-05 DIAGNOSIS — J302 Other seasonal allergic rhinitis: Secondary | ICD-10-CM

## 2021-03-05 DIAGNOSIS — J9611 Chronic respiratory failure with hypoxia: Secondary | ICD-10-CM | POA: Diagnosis not present

## 2021-03-05 DIAGNOSIS — Z6841 Body Mass Index (BMI) 40.0 and over, adult: Secondary | ICD-10-CM

## 2021-03-05 DIAGNOSIS — Z87891 Personal history of nicotine dependence: Secondary | ICD-10-CM

## 2021-03-05 DIAGNOSIS — R0683 Snoring: Secondary | ICD-10-CM | POA: Diagnosis not present

## 2021-03-05 DIAGNOSIS — J3089 Other allergic rhinitis: Secondary | ICD-10-CM

## 2021-03-05 MED ORDER — BREZTRI AEROSPHERE 160-9-4.8 MCG/ACT IN AERO: 2.0000 | INHALATION_SPRAY | Freq: Two times a day (BID) | RESPIRATORY_TRACT | 0 refills | Status: DC

## 2021-03-05 NOTE — Progress Notes (Signed)
Subjective:    Patient ID: Joanne Randall, female    DOB: 01/22/1956, 65 y.o.   MRN: [id number]  HPI Patient is a 65 year old former smoker who presents for evaluation of shortness of breath of 5 years duration.  She is kindly referred by Cyndi Bender, PA-C.  Patient notes that she has been very short of breath for approximately 5 years worse over the last 3 years.  She notes that albuterol inhaler helps some and Spiriva helps however causes her a lot of coughing.  She does do significant amount of coughing during the day regularly and produces "mucus" which is tenacious and cannot expectorate well.  She does not describe any chest pain but has had palpitations.  She has chronic orthopnea and cannot lay flat and has to sleep in a recliner.  She notes severe dyspnea on exertion, with high humidity and with "pollen".  She has been diagnosed with COPD in the past.  She has had issues with obesity of longstanding recently this year she has gained another 8 to 10 pounds of weight.  Family does note that she has loud snoring.  She snores so loudly that it sometimes wakes her up.  There are no incidents of apnea that she knows of or that she has been told.  She has chronic issues with heartburn and indigestion and is on Protonix for the same.  She does have lower extremity edema but has not had any paroxysmal nocturnal dyspnea.  In the past she has been employed with in welding, truck driving, currently is farming.  She was evaluated in 2014 by Dr. Baird Lyons for allergy issues.  Dr. Annamaria Boots performed PFT as below: PFT: 04/12/2013-moderate obstructive airways disease with response to bronchodilator, normal lung volumes, diffusion mildly reduced. FVC 2.7 tooth/80%, FEV1 1.86/70%, FEV1/FVC 0.68/86%,  FEF 25-75% 1.39/55%. TLC 99%, DLCO 69%.  Prior spirometry at [facility] 09/20/2012: FEV1 1.49 L or 57% predicted, FVC 2.30 L or 73% predicted FEV1/FVC 64%, consistent with moderate COPD   Review of  Systems A 10 point review of systems was performed and it is as noted above otherwise negative.  Past Medical History:  Diagnosis Date  . COPD (chronic obstructive pulmonary disease) ([place])   . Diabetes mellitus without complication ([place])   . DVT (deep venous thrombosis) ([place])   . Environmental allergies   . H/O blood clots   . Hypertension    History reviewed. No pertinent surgical history.    Family History  Problem Relation Age of Onset  . Allergies Mother        smoker  . Asthma Father        smoker  . Emphysema Father   . Allergies Daughter   . Allergies Maternal Grandmother   . Rheum arthritis Maternal Grandmother   . Rheum arthritis Maternal Grandfather   . Cancer Other        Maternal great grandmother    Social History   Tobacco Use  . Smoking status: Former Smoker    Packs/day: 2.00    Years: 21.00    Pack years: 42.00    Types: Cigarettes    Quit date: 11/22/1989    Years since quitting: 31.3  . Smokeless tobacco: Never Used  Substance Use Topics  . Alcohol use: No   Allergies  Allergen Reactions  . Aspirin     GI upset/Burns   Current Meds  Medication Sig  . albuterol (PROVENTIL) (2.5 MG/3ML) 0.083% nebulizer solution 1 vial QID prn  .  Budeson-Glycopyrrol-Formoterol (BREZTRI AEROSPHERE) 160-9-4.8 MCG/ACT AERO Inhale 2 puffs into the lungs in the morning and at bedtime.  . dabigatran (PRADAXA) 150 MG CAPS capsule TAKE 1 CAPSULE BY MOUTH TWICE DAILY FOR BLOOD CLOT PREVENTION  . glipiZIDE (GLUCOTROL XL) 5 MG 24 hr tablet TAKE 1 TABLET BY MOUTH IN THE MORNING WITH FOOD  . losartan (COZAAR) 25 MG tablet Take 25 mg by mouth daily.  . pantoprazole (PROTONIX) 40 MG tablet Take 40 mg by mouth daily.  . Potassium 99 MG TABS Take 1 tablet by mouth daily.  . propranolol (INDERAL) 20 MG tablet Take 2 tablets by mouth 2 (two) times daily.  Marland Kitchen sAXagliptin HCl (ONGLYZA PO) Take by mouth.  . triamcinolone cream (KENALOG) 0.1 % Apply 1 application topically 2 (two)  times daily.  . Turmeric 500 MG CAPS Take 1 capsule by mouth 2 (two) times daily.  Marland Kitchen ZOLOFT 50 MG tablet Take 25 mg by mouth daily.    There is no immunization history for the selected administration types on file for this patient.  We discussed Covid-19 precautions.  I reviewed the vaccine effectiveness and potential side effects in detail to include differences between mRNA vaccines and traditional vaccines (attenuated virus).  Discussion also offered of long-term effectiveness and safety profile which are unclear at this time.  Discussed current CDC guidance that all patients are recommended COVID-19 vaccinations -as long as they do not have allergy to components of the vaccine.      Objective:   Physical Exam BP 112/62 (BP Location: Left Arm, Patient Position: Sitting, Cuff Size: Normal)   Pulse 72   Temp (!) 97 F (36.1 C) (Temporal)   Ht 5' 2.5" (1.588 m)   Wt 239 lb 3.2 oz (108.5 kg)   SpO2 91%   BMI 43.05 kg/m  GENERAL: Obese woman, no acute distress.  Mild tachypnea but no conversational dyspnea.  Fully ambulatory. HEAD: Normocephalic, atraumatic.  EYES: Pupils equal, round, reactive to light.  No scleral icterus.  MOUTH: Nose/mouth/throat not examined due to masking requirements for COVID 19. NECK: Supple. No thyromegaly. Trachea midline. No JVD.  No adenopathy. PULMONARY: Good air entry bilaterally.  No adventitious sounds.  No rubs, murmurs or gallops heard. CARDIOVASCULAR: S1 and S2. Regular rate and rhythm.  ABDOMEN: Obese otherwise benign. MUSCULOSKELETAL: No joint deformity, no clubbing, no edema.  NEUROLOGIC: No focal deficit, no gait disturbance, speech is fluent. SKIN: Intact,warm,dry. PSYCH: Mood and behavior normal  Ambulatory oximetry was performed today: Patient desaturated to 85% during ambulation.  On 2 L/min she maintained oxygen saturations at 94%.  The patient declines oxygen AGAINST MEDICAL ADVICE.     Assessment & Plan:     ICD-10-CM   1. COPD  suggested by initial evaluation [facility])  J44.9 Pulmonary Function Test [facility] Only   She will need PFTs We will give her a trial of Breztri 2 puffs twice a day with spacer She is not to use Spiriva with the Breztri  2. Chronic respiratory failure with hypoxia (HCC)  J96.11    She met criteria for oxygen supplementation She declines oxygen supplementation AGAINST MEDICAL ADVICE  3. Shortness of breath  R06.02 ECHOCARDIOGRAM COMPLETE   Multifactorial COPD, obesity, query cardiac PFTs, 2D echo  4. Loud snoring  R06.83    She is at high risk for sleep apnea She declines sleep study  5. Class 3 severe obesity due to excess calories without serious comorbidity with body mass index (BMI) of 40.0 to 44.9 in adult [facility])  E66.01    Z68.41    Weight loss is recommended Suspect element of obesity with obesity hypoventilation  6. Seasonal and perennial allergic rhinitis  J30.89    J30.2    Nasal hygiene recommended  7. Former smoker  Z87.891    This issue adds complexity to her management   Orders Placed This Encounter  Procedures  . Pulmonary Function Test [facility] Only    Standing Status:   Future    Standing Expiration Date:   03/05/2022    Scheduling Instructions:     3 weeks    Order Specific Question:   Full PFT: includes the following: basic spirometry, spirometry pre & post bronchodilator, diffusion capacity (DLCO), lung volumes    Answer:   Full PFT    Order Specific Question:   This test can only be performed at    Answer:   [facility]  . ECHOCARDIOGRAM COMPLETE    Standing Status:   Future    Standing Expiration Date:   09/04/2021    Scheduling Instructions:     3 weeks    Order Specific Question:   Where should this test be performed    Answer:   [facility]    Order Specific Question:   Perflutren DEFINITY (image enhancing agent) should be administered unless hypersensitivity or allergy exist    Answer:   Administer Perflutren    Order Specific Question:   Reason for  exam-Echo    Answer:   Dyspnea  R06.00   Meds ordered this encounter  Medications  . Budeson-Glycopyrrol-Formoterol (BREZTRI AEROSPHERE) 160-9-4.8 MCG/ACT AERO    Sig: Inhale 2 puffs into the lungs in the morning and at bedtime.    Dispense:  5.9 g    Refill:  0    Order Specific Question:   Lot Number?    Answer:   [id number]    Order Specific Question:   Expiration Date?    Answer:   06/22/2022    Order Specific Question:   Manufacturer?    Answer:   [employer] [[id number]]    Order Specific Question:   Quantity    Answer:   2   Discussion:  Patient's dyspnea of longstanding and likely related to multiple issues to include COPD, obesity with obesity hypoventilation syndrome and possibly secondary pulmonary hypertension due to chronic hypoxic vasoconstriction.  Patient does show evidence of hypoxia with exertion.  Her baseline O2 saturation is marginal.  She however declines oxygen supplementation AGAINST MEDICAL ADVICE.  Patient also is at high risk for obstructive sleep apnea and a sleep study was recommended, she declines [place].  I discussed with the patient that she needs to be compliant with recommendations otherwise we will not be able to help her appropriately.  She is "going to think about it".  We did educate the patient in the dangers of not having supplemental oxygen in the setting of hypoxia.  She continues to decline this therapy.  Will initiate trial of Breztri 2 puffs twice a day.  Patient was instructed on the proper use of the MDI plus spacer.  She was instructed not to use Spiriva while using the [place].  She was also instructed to rinse mouth well after use.  Of note the CT angio chest scan in 2013 at [facility] showed that the patient had central right and segmental left lower lobe pulmonary emboli.  Study was performed 05/25/2012.  Patient did not mention this when asked about her past medical history.  This was  discovered after patient left the office.   This may add to the issue of potential pulmonary hypertension.  PFTs and 2D echo will be ordered.  We will see the patient in follow-up in 4 to 6 weeks time she is to contact us prior to that time should any new difficulties arise.  She has been asked to consider oxygen use and sleep study.  Renold Don, MD [facility]   *This note was dictated using voice recognition software/Dragon.  Despite best efforts to proofread, errors can occur which can change the meaning.  Any change was purely unintentional.

## 2021-03-05 NOTE — Patient Instructions (Signed)
I have recommended for you a sleep study and supplemental oxygen.  You have declined these two AGAINST MEDICAL ADVICE.  Please think about this and let us know if you want to reconsider.  We are giving you a trial of Breztri 2 puffs twice a day.  Use the spacer to get the medication into your lungs.  Make sure you rinse your mouth well after you use it.  Call us if this medication is working for you so we can call the prescription into your pharmacy.  DO NOT USE SPIRIVA WHILE YOU USE THE BREZTRI.  We are scheduling breathing tests and a heart test.  We will see you in follow-up in 4 to 6 weeks time with either me or the nurse practitioner.

## 2021-03-10 ENCOUNTER — Telehealth: Payer: Self-pay | Admitting: Pulmonary Disease

## 2021-03-10 NOTE — Telephone Encounter (Signed)
Synetta Fail, do you know if echo requires pre cert?

## 2021-03-11 NOTE — Telephone Encounter (Signed)
I don't normally do Prior Auths until 2 weeks before appts. I have started the PA and had to fax records

## 2021-03-12 NOTE — Telephone Encounter (Signed)
I have spoke with Mrs. Higham and she wanted to know how much it would cost her to do the Echo. I spoke with Sabrina with [facility] and she stated the charge is $742.00.   Joanne Randall has agreed to now do the Echo.  She also has her PFT scheduled the same day 04/02/21 @ 2:00pm

## 2021-03-12 NOTE — Telephone Encounter (Signed)
I have left a message for the patient to call me back. I only get PA's from the insurance companies.  Auth # [id number], Prior Auth Not Required for code [phone] Refer # [id number]. I had to fax records to get code [id number] approved

## 2021-03-17 ENCOUNTER — Telehealth: Payer: Self-pay | Admitting: Adult Health

## 2021-03-17 NOTE — Telephone Encounter (Signed)
She may resume Spiriva

## 2021-03-17 NOTE — Telephone Encounter (Signed)
Called and spoke to patient.  Patient stated that she was given a sample of Breztri at her last OV.  She feels that her sx have worsen since starting Breztri. She reports of chest congestion and dry cough. Denied fever, chills, sweats or additional sx.  She would like to switch back to Spiriva.  Dr. Jayme Cloud, please advise. thanks

## 2021-03-17 NOTE — Telephone Encounter (Signed)
Patient is aware of below recommendations and voiced her understanding. Nothing further needed at this time.   

## 2021-03-17 NOTE — Telephone Encounter (Signed)
Patient called my phone while I was at lunch. She stated she wanted the nurse to call or Tammy Parrett

## 2021-03-30 ENCOUNTER — Telehealth: Payer: Self-pay

## 2021-03-30 NOTE — Telephone Encounter (Signed)
[name] does she need to keep the appt with Tammy Parrett on 5/24  If she doesn't get the test done that was ordered

## 2021-03-30 NOTE — Telephone Encounter (Signed)
Pt returning a phone call to let Synetta Fail know what type of Covid test her pcp does. Pt can be reached at [phone].

## 2021-03-30 NOTE — Telephone Encounter (Signed)
Lm for reminder of covid test prior to PFT.  04/01/2021 [facility]  

## 2021-03-30 NOTE — Telephone Encounter (Signed)
All appts have now been CXL except the appt with Tammy Parrett on 04/14/21

## 2021-03-30 NOTE — Telephone Encounter (Signed)
Spoke to patient, who stated that she can not make it to be covid tested prior to 1:30. I have spoken to [facility] with PAT, who stated that patient would have to come between 8-12.  Lm to make patient aware.

## 2021-03-30 NOTE — Telephone Encounter (Signed)
Joanne Randall called me back and stated that [facility] office uses the PCR covid Test and wanted to know if she could get tested there before she does the PFT when we finally get the PFT rescheduled

## 2021-03-30 NOTE — Telephone Encounter (Signed)
I have spoke with Victorino Dike with [facility] and she has CXL the Echo. I had to leave a message for [facility] with scheduling to CXL PFT and covid test

## 2021-03-30 NOTE — Telephone Encounter (Signed)
Patient called back wanting to reschedule her PFT and I explained that we don't have anymore appts for May and we are waiting on June PFT schedule. She stated she would check with her PCP to see if they have the PCR Covid test.  If they can do the covid test then she will get all the appts rescheduled

## 2021-03-30 NOTE — Telephone Encounter (Signed)
Patient is aware of below message and voiced her understanding. Patient would like to cancel PFT and echo.  She will call back to reschedule.  Synetta Fail, please advise. Thanks

## 2021-03-31 NOTE — Telephone Encounter (Signed)
Patient is aware that she can have PCR test at PCP, as long as she brings copy of results and test is preformed day prior to PFT. She voiced her understanding and had no further questions.  Nothing further needed at this time.

## 2021-04-01 ENCOUNTER — Other Ambulatory Visit: Payer: 59

## 2021-04-02 ENCOUNTER — Ambulatory Visit: Payer: 59

## 2021-04-02 ENCOUNTER — Other Ambulatory Visit: Payer: 59

## 2021-04-14 ENCOUNTER — Ambulatory Visit: Payer: 59 | Admitting: Adult Health

## 2021-04-24 ENCOUNTER — Telehealth: Payer: Self-pay

## 2021-04-24 NOTE — Telephone Encounter (Signed)
Lm for reminder of covid test prior to PFT.  04/28/2021 11:[facility]

## 2021-04-24 NOTE — Telephone Encounter (Signed)
Patient is aware of covid test and voiced her understanding. Nothing further needed at this time.

## 2021-04-28 ENCOUNTER — Other Ambulatory Visit
Admission: RE | Admit: 2021-04-28 | Discharge: 2021-04-28 | Disposition: A | Discharge status: Home / Self Care | Payer: 59 | Source: Ambulatory Visit | Attending: Pulmonary Disease | Admitting: Pulmonary Disease

## 2021-04-28 ENCOUNTER — Other Ambulatory Visit: Payer: Self-pay

## 2021-04-28 DIAGNOSIS — Z01812 Encounter for preprocedural laboratory examination: Secondary | ICD-10-CM | POA: Diagnosis not present

## 2021-04-28 DIAGNOSIS — Z20822 Contact with and (suspected) exposure to covid-19: Secondary | ICD-10-CM | POA: Insufficient documentation

## 2021-04-28 LAB — SARS CORONAVIRUS 2 (TAT 6-24 HRS): SARS Coronavirus 2: NEGATIVE

## 2021-04-29 ENCOUNTER — Ambulatory Visit: Payer: 59 | Attending: Pulmonary Disease

## 2021-04-29 DIAGNOSIS — J449 Chronic obstructive pulmonary disease, unspecified: Secondary | ICD-10-CM | POA: Insufficient documentation

## 2021-04-29 DIAGNOSIS — Z87891 Personal history of nicotine dependence: Secondary | ICD-10-CM | POA: Insufficient documentation

## 2021-04-29 MED ORDER — ALBUTEROL SULFATE (2.5 MG/3ML) 0.083% IN NEBU
2.5000 mg | INHALATION_SOLUTION | Freq: Once | RESPIRATORY_TRACT | Status: AC
  Administered 2021-04-29: 2.5 mg via RESPIRATORY_TRACT
  Filled 2021-04-29: qty 3

## 2021-05-28 ENCOUNTER — Ambulatory Visit (INDEPENDENT_AMBULATORY_CARE_PROVIDER_SITE_OTHER): Payer: 59

## 2021-05-28 ENCOUNTER — Other Ambulatory Visit: Payer: Self-pay

## 2021-05-28 DIAGNOSIS — R0602 Shortness of breath: Secondary | ICD-10-CM | POA: Diagnosis not present

## 2021-05-28 LAB — ECHOCARDIOGRAM COMPLETE
AR max vel: 2.24 cm2
AV Area VTI: 2.3 cm2
AV Area mean vel: 2.23 cm2
AV Mean grad: 4 mmHg
AV Peak grad: 7.7 mmHg
Ao pk vel: 1.39 m/s
Area-P 1/2: 3.36 cm2
Calc EF: 57 %
S' Lateral: 2.6 cm
Single Plane A2C EF: 55.2 %
Single Plane A4C EF: 53.6 %

## 2021-05-29 ENCOUNTER — Telehealth: Payer: Self-pay | Admitting: Pulmonary Disease

## 2021-05-29 NOTE — Telephone Encounter (Signed)
Joanne Randall called me because she said she couldn't get anyone to answer the phone. She was calling to get echo results. I see [name] tried to call her this morning

## 2021-05-29 NOTE — Telephone Encounter (Signed)
Salena Saner, MD  05/28/2021  6:00 PM EDT      Echocardiogram shows that she has some mild stiffness of the main chamber of the heart.  Otherwise her heart function is normal.  The stiffness of the heart happens as we age and also can be associated with obstructive sleep apnea,obesity and high blood pressure.  Weight loss may be beneficial.    Patient is aware of results and voiced her understanding.  Nothing further needed.

## 2022-09-09 ENCOUNTER — Other Ambulatory Visit: Payer: Self-pay | Admitting: *Deleted

## 2022-09-09 DIAGNOSIS — I809 Phlebitis and thrombophlebitis of unspecified site: Secondary | ICD-10-CM

## 2022-09-28 ENCOUNTER — Encounter: Payer: Self-pay | Admitting: Vascular Surgery

## 2022-09-28 ENCOUNTER — Ambulatory Visit: Payer: 59 | Admitting: Vascular Surgery

## 2022-09-28 ENCOUNTER — Ambulatory Visit (HOSPITAL_COMMUNITY)
Admission: RE | Admit: 2022-09-28 | Discharge: 2022-09-28 | Disposition: A | Discharge status: Home / Self Care | Payer: Medicare Other | Source: Ambulatory Visit | Attending: Vascular Surgery | Admitting: Vascular Surgery

## 2022-09-28 VITALS — BP 105/62 | HR 62 | Temp 98.0°F | Resp 18 | Ht 64.0 in | Wt 229.8 lb

## 2022-09-28 DIAGNOSIS — I8002 Phlebitis and thrombophlebitis of superficial vessels of left lower extremity: Secondary | ICD-10-CM

## 2022-09-28 DIAGNOSIS — I809 Phlebitis and thrombophlebitis of unspecified site: Secondary | ICD-10-CM | POA: Diagnosis present

## 2022-09-28 DIAGNOSIS — I872 Venous insufficiency (chronic) (peripheral): Secondary | ICD-10-CM | POA: Diagnosis not present

## 2022-09-28 DIAGNOSIS — M7989 Other specified soft tissue disorders: Secondary | ICD-10-CM
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
## 2022-10-05 ENCOUNTER — Other Ambulatory Visit: Payer: Self-pay

## 2022-10-05 DIAGNOSIS — I6523 Occlusion and stenosis of bilateral carotid arteries: Secondary | ICD-10-CM

## 2022-12-29 ENCOUNTER — Ambulatory Visit: Payer: 59 | Admitting: Vascular Surgery

## 2022-12-29 ENCOUNTER — Ambulatory Visit (HOSPITAL_COMMUNITY): Payer: 59

## 2023-01-26 ENCOUNTER — Ambulatory Visit (INDEPENDENT_AMBULATORY_CARE_PROVIDER_SITE_OTHER): Payer: Medicare HMO | Admitting: Vascular Surgery

## 2023-01-26 ENCOUNTER — Encounter: Payer: Self-pay | Admitting: Vascular Surgery

## 2023-01-26 ENCOUNTER — Ambulatory Visit (HOSPITAL_COMMUNITY)
Admission: RE | Admit: 2023-01-26 | Discharge: 2023-01-26 | Disposition: A | Discharge status: Home / Self Care | Payer: Medicare HMO | Source: Ambulatory Visit | Attending: Vascular Surgery | Admitting: Vascular Surgery

## 2023-01-26 VITALS — BP 96/64 | HR 66 | Temp 97.8°F | Resp 16 | Ht 65.0 in | Wt 232.0 lb

## 2023-01-26 DIAGNOSIS — I872 Venous insufficiency (chronic) (peripheral): Secondary | ICD-10-CM

## 2023-01-26 DIAGNOSIS — I6523 Occlusion and stenosis of bilateral carotid arteries: Secondary | ICD-10-CM | POA: Insufficient documentation

## 2023-01-26 DIAGNOSIS — R0989 Other specified symptoms and signs involving the circulatory and respiratory systems: Secondary | ICD-10-CM

## 2023-01-26 NOTE — Progress Notes (Signed)
REASON FOR VISIT:   Follow-up of venous insufficiency.  MEDICAL ISSUES:   CHRONIC VENOUS INSUFFICIENCY: This patient does have symptoms of venous hypertension.  We have again discussed the importance of exercise and trying to avoid prolonged sitting and standing.  Of note her exercise is limited by her pulmonary status.  I encouraged her to get up and walk when she is sitting at home for work.  We also discussed importance of compression stockings.  She may do better with a knee-high stocking when she is sitting.  He also encouraged to elevate her legs at least 15 to 20 minutes daily we discussed the proper positioning for this.  We also discussed the importance of maintaining a healthy weight as central obesity especially increases lower extremity's pressure.  He has significant deep venous reflux.  She does have some superficial venous reflux on the left but the vein is only dilated in the thigh and I am not sure addressing this would significantly impact her venous hypertension.  If her symptoms progress I think we can reevaluate this.  BILATERAL CAROTID BRUITS: She had previously been found to have bilateral carotid bruits.  This prompted a venous duplex scan today.  This showed no evidence of significant carotid disease bilaterally.  Both vertebral arteries likewise were patent as were the subclavian arteries.  No further follow-up is needed.   HPI:   Joanne Randall is a pleasant 67 y.o. female who was seen by Dr. Stanford Breed on 09/28/2022 with chronic venous insufficiency.  She has C5 venous disease.  She had a healed ulcer on her left medial ankle.  On my history, the patient has had multiple DVTs in the past.  She has had no recent DVTs.  She is not currently on anticoagulation.  She does describe some aching pain and heaviness in both legs which is aggravated by standing and sitting and relieved with elevation.  She cannot lie completely flat because of her COPD.  She did have a small wound  on her right medial malleolus according to the patient.  She has had discoloration of her legs for some time.  She has been wearing her thigh-high compression stockings.  There have been no other significant changes in her medical history since her last visit.  Past Medical History:  Diagnosis Date   COPD (chronic obstructive pulmonary disease) ([place])    Diabetes mellitus without complication ([place])    DVT (deep venous thrombosis) ([facility])    Environmental allergies    H/O blood clots    Hypertension    Pulmonary embolism ([place]) 05/25/2012   [facility]    Family History  Problem Relation Age of Onset   Allergies Mother        smoker   Asthma Father        smoker   Emphysema Father    Allergies Daughter    Allergies Maternal Grandmother    Rheum arthritis Maternal Grandmother    Rheum arthritis Maternal Grandfather    Cancer Other        Maternal great grandmother    SOCIAL HISTORY: Social History   Tobacco Use   Smoking status: Former    Packs/day: 2.00    Years: 21.00    Total pack years: 42.00    Types: Cigarettes    Quit date: 11/22/1989    Years since quitting: 33.2   Smokeless tobacco: Never  Substance Use Topics   Alcohol use: No    Allergies  Allergen Reactions  Aspirin     GI upset/Burns    Current Outpatient Medications  Medication Sig Dispense Refill   albuterol (PROVENTIL) (2.5 MG/3ML) 0.083% nebulizer solution 1 vial QID prn     Budeson-Glycopyrrol-Formoterol (BREZTRI AEROSPHERE) 160-9-4.8 MCG/ACT AERO Inhale 2 puffs into the lungs in the morning and at bedtime. 5.9 g 0   dabigatran (PRADAXA) 150 MG CAPS capsule TAKE 1 CAPSULE BY MOUTH TWICE DAILY FOR BLOOD CLOT PREVENTION     escitalopram (LEXAPRO) 10 MG tablet Take 10 mg by mouth daily.     glipiZIDE (GLUCOTROL XL) 5 MG 24 hr tablet TAKE 1 TABLET BY MOUTH IN THE MORNING WITH FOOD     losartan (COZAAR) 25 MG tablet Take 25 mg by mouth daily.     pantoprazole (PROTONIX) 40 MG tablet Take 40 mg  by mouth daily.     Potassium 99 MG TABS Take 1 tablet by mouth daily.     propranolol (INDERAL) 20 MG tablet Take 2 tablets by mouth 2 (two) times daily.     sAXagliptin HCl (ONGLYZA PO) Take by mouth.     Turmeric 500 MG CAPS Take 1 capsule by mouth 2 (two) times daily.     cephALEXin (KEFLEX) 500 MG capsule Take 500 mg by mouth 2 (two) times daily. (Patient not taking: Reported on 03/05/2021)     triamcinolone cream (KENALOG) 0.1 % Apply 1 application  topically 2 (two) times daily. (Patient not taking: Reported on 01/26/2023)     ZOLOFT 50 MG tablet Take 25 mg by mouth daily. (Patient not taking: Reported on 01/26/2023)     No current facility-administered medications for this visit.    REVIEW OF SYSTEMS:  '[X]'$  denotes positive finding, '[ ]'$  denotes negative finding Cardiac  Comments:  Chest pain or chest pressure:    Shortness of breath upon exertion: x   Short of breath when lying flat:    Irregular heart rhythm:        Vascular    Pain in calf, thigh, or hip brought on by ambulation:    Pain in feet at night that wakes you up from your sleep:     Blood clot in your veins:    Leg swelling:  x       Pulmonary    Oxygen at home:    Productive cough:     Wheezing:         Neurologic    Sudden weakness in arms or legs:     Sudden numbness in arms or legs:     Sudden onset of difficulty speaking or slurred speech:    Temporary loss of vision in one eye:     Problems with dizziness:         Gastrointestinal    Blood in stool:     Vomited blood:         Genitourinary    Burning when urinating:     Blood in urine:        Psychiatric    Major depression:         Hematologic    Bleeding problems:    Problems with blood clotting too easily:        Skin    Rashes or ulcers:        Constitutional    Fever or chills:     PHYSICAL EXAM:   Vitals:   01/26/23 1323  BP: 96/64  Pulse: 66  Resp: 16  Temp: 97.8 F (36.6 C)  TempSrc: Temporal  Weight: 232 lb (105.2 kg)   Height: '5\' 5"'$  (1.651 m)    GENERAL: The patient is a well-nourished female, in no acute distress. The vital signs are documented above. CARDIAC: There is a regular rate and rhythm.  VASCULAR: I do not detect carotid bruits. On the right side she has a biphasic dorsalis pedis and posterior tibial signal. On the left side she has a biphasic anterior tibial and posterior tibial signal. She has hyperpigmentation bilaterally.  I did look at her left great saphenous vein myself with the SonoSite.  It was dilated in the proximal thigh to below that today was fairly small. PULMONARY: There is good air exchange bilaterally without wheezing or rales. ABDOMEN: Soft and non-tender with normal pitched bowel sounds.  MUSCULOSKELETAL: There are no major deformities or cyanosis. NEUROLOGIC: No focal weakness or paresthesias are detected. SKIN: There are no ulcers or rashes noted. PSYCHIATRIC: The patient has a normal affect.  DATA:    VENOUS DUPLEX: I have interpreted the venous duplex scan that was done on 09/28/2022.  This was of the left lower extremity only.  There was no evidence of DVT.  There was deep venous reflux involving the common femoral vein, femoral vein, and popliteal vein.  There was superficial venous reflux in the great saphenous vein which was dilated up to 5.8 mm in the proximal thigh.  There was some thrombus in the great saphenous vein in the proximal calf.  There was reflux in the small saphenous vein.  Diameters of the small saphenous vein ranged from 3.4-3.5 mm.  Results of the study are summarized in the diagram below.    CAROTID DUPLEX: I have independently interpreted the carotid duplex scan today.  This was done because the patient had bilateral carotid bruits.  There was a less than 39% stenosis bilaterally.  Both vertebral arteries were patent with antegrade flow.  Deitra Mayo [facility] [phone]

## 2024-01-14 DIAGNOSIS — J9611 Chronic respiratory failure with hypoxia: Secondary | ICD-10-CM | POA: Diagnosis not present

## 2024-01-24 DIAGNOSIS — E782 Mixed hyperlipidemia: Secondary | ICD-10-CM | POA: Diagnosis not present

## 2024-01-24 DIAGNOSIS — R609 Edema, unspecified: Secondary | ICD-10-CM | POA: Diagnosis not present

## 2024-01-24 DIAGNOSIS — I10 Essential (primary) hypertension: Secondary | ICD-10-CM | POA: Diagnosis not present

## 2024-01-24 DIAGNOSIS — R0609 Other forms of dyspnea: Secondary | ICD-10-CM | POA: Diagnosis not present

## 2024-01-24 DIAGNOSIS — E1129 Type 2 diabetes mellitus with other diabetic kidney complication: Secondary | ICD-10-CM | POA: Diagnosis not present

## 2024-01-24 DIAGNOSIS — R809 Proteinuria, unspecified: Secondary | ICD-10-CM | POA: Diagnosis not present

## 2024-02-09 ENCOUNTER — Encounter: Payer: Self-pay | Admitting: Cardiology

## 2024-02-11 DIAGNOSIS — J9611 Chronic respiratory failure with hypoxia: Secondary | ICD-10-CM | POA: Diagnosis not present

## 2024-02-14 DIAGNOSIS — Z9981 Dependence on supplemental oxygen: Secondary | ICD-10-CM | POA: Diagnosis not present

## 2024-02-14 DIAGNOSIS — R0981 Nasal congestion: Secondary | ICD-10-CM | POA: Diagnosis not present

## 2024-02-14 DIAGNOSIS — R0602 Shortness of breath: Secondary | ICD-10-CM | POA: Diagnosis not present

## 2024-02-14 DIAGNOSIS — J439 Emphysema, unspecified: Secondary | ICD-10-CM | POA: Diagnosis not present

## 2024-02-15 DIAGNOSIS — J449 Chronic obstructive pulmonary disease, unspecified: Secondary | ICD-10-CM | POA: Diagnosis not present

## 2024-02-15 DIAGNOSIS — R809 Proteinuria, unspecified: Secondary | ICD-10-CM | POA: Diagnosis not present

## 2024-02-15 DIAGNOSIS — I10 Essential (primary) hypertension: Secondary | ICD-10-CM | POA: Diagnosis not present

## 2024-02-15 DIAGNOSIS — E1129 Type 2 diabetes mellitus with other diabetic kidney complication: Secondary | ICD-10-CM | POA: Diagnosis not present

## 2024-02-21 ENCOUNTER — Other Ambulatory Visit: Payer: Self-pay | Admitting: Emergency Medicine

## 2024-02-21 DIAGNOSIS — J439 Emphysema, unspecified: Secondary | ICD-10-CM

## 2024-02-21 DIAGNOSIS — R0602 Shortness of breath: Secondary | ICD-10-CM

## 2024-02-29 ENCOUNTER — Ambulatory Visit
Admission: RE | Admit: 2024-02-29 | Discharge: 2024-02-29 | Disposition: A | Discharge status: Home / Self Care | Source: Ambulatory Visit | Attending: Emergency Medicine | Admitting: Emergency Medicine

## 2024-02-29 DIAGNOSIS — R0602 Shortness of breath: Secondary | ICD-10-CM | POA: Insufficient documentation

## 2024-02-29 DIAGNOSIS — J439 Emphysema, unspecified: Secondary | ICD-10-CM | POA: Diagnosis not present

## 2024-02-29 DIAGNOSIS — R0600 Dyspnea, unspecified: Secondary | ICD-10-CM | POA: Diagnosis not present

## 2024-03-02 ENCOUNTER — Other Ambulatory Visit

## 2024-03-07 DIAGNOSIS — I8390 Asymptomatic varicose veins of unspecified lower extremity: Secondary | ICD-10-CM | POA: Diagnosis not present

## 2024-03-09 ENCOUNTER — Encounter: Payer: Self-pay | Admitting: Cardiology

## 2024-03-09 ENCOUNTER — Ambulatory Visit: Attending: Cardiology | Admitting: Cardiology

## 2024-03-09 ENCOUNTER — Telehealth: Payer: Self-pay | Admitting: Cardiology

## 2024-03-09 ENCOUNTER — Other Ambulatory Visit: Payer: Self-pay

## 2024-03-09 VITALS — BP 124/64 | HR 61 | Ht 66.5 in | Wt 218.2 lb

## 2024-03-09 DIAGNOSIS — I2510 Atherosclerotic heart disease of native coronary artery without angina pectoris: Secondary | ICD-10-CM | POA: Diagnosis not present

## 2024-03-09 DIAGNOSIS — R0609 Other forms of dyspnea: Secondary | ICD-10-CM | POA: Diagnosis not present

## 2024-03-09 DIAGNOSIS — I10 Essential (primary) hypertension: Secondary | ICD-10-CM | POA: Diagnosis not present

## 2024-03-09 DIAGNOSIS — E782 Mixed hyperlipidemia: Secondary | ICD-10-CM | POA: Diagnosis not present

## 2024-03-09 NOTE — Patient Instructions (Signed)
 Medication Instructions:  Your Physician recommend you continue on your current medication as directed.     *If you need a refill on your cardiac medications before your next appointment, please call your pharmacy*  Lab Work: Your provider would like for you to have following labs drawn today Lipid panel.    If you have labs (blood work) drawn today and your tests are completely normal, you will receive your results only by: MyChart Message (if you have MyChart) OR A paper copy in the mail If you have any lab test that is abnormal or we need to change your treatment, we will call you to review the results.  Testing/Procedures: Your physician has requested that you have an echocardiogram. Echocardiography is a painless test that uses sound waves to create images of your heart. It provides your doctor with information about the size and shape of your heart and how well your heart's chambers and valves are working.   You may receive an ultrasound enhancing agent through an IV if needed to better visualize your heart during the echo. This procedure takes approximately one hour.  There are no restrictions for this procedure.  This will take place at [address] [facility]) [address], [place] [address]  Please note: We ask at that you not bring children with you during ultrasound (echo/ vascular) testing. Due to room size and safety concerns, children are not allowed in the ultrasound rooms during exams. Our front office staff cannot provide observation of children in our lobby area while testing is being conducted. An adult accompanying a patient to their appointment will only be allowed in the ultrasound room at the discretion of the ultrasound technician under special circumstances. We apologize for any inconvenience.     Your provider has ordered a Lexiscan/ Exercise Myoview Stress test. This will take place at [facility]. Please report to the [facility] medical mall entrance. The  volunteers at the first desk will direct you where to go.  [facility] MYOVIEW  Your provider has ordered a Stress Test with nuclear imaging. The purpose of this test is to evaluate the blood supply to your heart muscle. This procedure is referred to as a "Non-Invasive Stress Test." This is because other than having an IV started in your vein, nothing is inserted or "invades" your body. Cardiac stress tests are done to find areas of poor blood flow to the heart by determining the extent of coronary artery disease (CAD). Some patients exercise on a treadmill, which naturally increases the blood flow to your heart, while others who are unable to walk on a treadmill due to physical limitations will have a pharmacologic/chemical stress agent called Lexiscan . This medicine will mimic walking on a treadmill by temporarily increasing your coronary blood flow.   Please note: these test may take anywhere between 2-4 hours to complete  How to prepare for your Myoview test:  Nothing to eat for 6 hours prior to the test No caffeine for 24 hours prior to test No smoking 24 hours prior to test. Your medication may be taken with water.  If your doctor stopped a medication because of this test, do not take that medication. Ladies, please do not wear dresses.  Skirts or pants are appropriate. Please wear a short sleeve shirt. No perfume, cologne or lotion. Wear comfortable walking shoes. No heels!   PLEASE NOTIFY THE OFFICE AT LEAST 24 HOURS IN ADVANCE IF YOU ARE UNABLE TO KEEP YOUR APPOINTMENT.  [phone] AND  PLEASE  NOTIFY NUCLEAR MEDICINE AT [facility] AT LEAST 24 HOURS IN ADVANCE IF YOU ARE UNABLE TO KEEP YOUR APPOINTMENT. [phone]   Follow-Up: At [facility], you and your health needs are our priority.  As part of our continuing mission to provide you with exceptional heart care, our providers are all part of one team.  This team includes your primary Cardiologist (physician) and Advanced Practice  Providers or APPs (Physician Assistants and Nurse Practitioners) who all work together to provide you with the care you need, when you need it.  Your next appointment:   3 month(s)  Provider:   You may see Dr. Junnie Olives or one of the following Advanced Practice Providers on your designated Care Team:   Laneta Pintos, NP Gildardo Labrador, PA-C Varney Gentleman, PA-C Cadence Stratford, PA-C Ronald Cockayne, NP Morey Ar, NP    We recommend signing up for the patient portal called "MyChart".  Sign up information is provided on this After Visit Summary.  MyChart is used to connect with patients for Virtual Visits (Telemedicine).  Patients are able to view lab/test results, encounter notes, upcoming appointments, etc.  Non-urgent messages can be sent to your provider as well.   To learn more about what you can do with MyChart, go to [email or web address].

## 2024-03-09 NOTE — Telephone Encounter (Signed)
  Pt c/o medication issue:  1. Name of Medication: fenofibrate 160 mg   2. How are you currently taking this medication (dosage and times per day)? 1 tablet a day  3. Are you having a reaction (difficulty breathing--STAT)?   4. What is your medication issue? Pt said, she is currently taking this medication and would like to let Dr. Junnie Olives know
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
## 2024-03-10 LAB — LIPID PANEL
Chol/HDL Ratio: 4 ratio (ref 0.0–4.4)
Cholesterol, Total: 143 mg/dL (ref 100–199)
HDL: 36 mg/dL — ABNORMAL LOW (ref >39)
LDL Chol Calc (NIH): 93 mg/dL (ref 0–99)
Triglycerides: 68 mg/dL (ref 0–149)
VLDL Cholesterol Cal: 14 mg/dL (ref 5–40)

## 2024-03-13 DIAGNOSIS — J9611 Chronic respiratory failure with hypoxia: Secondary | ICD-10-CM | POA: Diagnosis not present

## 2024-03-19 ENCOUNTER — Ambulatory Visit
Admission: RE | Admit: 2024-03-19 | Discharge: 2024-03-19 | Disposition: A | Discharge status: Home / Self Care | Source: Ambulatory Visit | Attending: Cardiology | Admitting: Cardiology

## 2024-03-19 DIAGNOSIS — R0609 Other forms of dyspnea: Secondary | ICD-10-CM | POA: Insufficient documentation

## 2024-03-19 LAB — NM MYOCAR MULTI W/SPECT W/WALL MOTION / EF
LV dias vol: 48 mL (ref 46–106)
LV sys vol: 14 mL
MPHR: 153 {beats}/min
Nuc Stress EF: 71 %
Peak HR: 83 {beats}/min
Percent HR: 54 %
Rest HR: 70 {beats}/min
Rest Nuclear Isotope Dose: 10.8 mCi
SDS: 0
SRS: 15
SSS: 2
Stress Nuclear Isotope Dose: 31.1 mCi
TID: 0.89

## 2024-03-19 MED ORDER — REGADENOSON 0.4 MG/5ML IV SOLN
0.4000 mg | Freq: Once | INTRAVENOUS | Status: AC
  Administered 2024-03-19: 0.4 mg via INTRAVENOUS

## 2024-03-19 MED ORDER — TECHNETIUM TC 99M TETROFOSMIN IV KIT
30.0000 | PACK | Freq: Once | INTRAVENOUS | Status: AC | PRN
  Administered 2024-03-19: 31.06 via INTRAVENOUS

## 2024-03-19 MED ORDER — TECHNETIUM TC 99M TETROFOSMIN IV KIT
10.0000 | PACK | Freq: Once | INTRAVENOUS | Status: AC | PRN
  Administered 2024-03-19: 10.78 via INTRAVENOUS

## 2024-04-04 DIAGNOSIS — J439 Emphysema, unspecified: Secondary | ICD-10-CM | POA: Diagnosis not present

## 2024-04-05 DIAGNOSIS — E119 Type 2 diabetes mellitus without complications: Secondary | ICD-10-CM | POA: Diagnosis not present

## 2024-04-05 DIAGNOSIS — H2513 Age-related nuclear cataract, bilateral: Secondary | ICD-10-CM | POA: Diagnosis not present

## 2024-04-05 DIAGNOSIS — H43393 Other vitreous opacities, bilateral: Secondary | ICD-10-CM | POA: Diagnosis not present

## 2024-04-11 ENCOUNTER — Ambulatory Visit: Payer: Self-pay | Admitting: Cardiology

## 2024-04-11 ENCOUNTER — Ambulatory Visit: Attending: Cardiology

## 2024-04-11 DIAGNOSIS — R0609 Other forms of dyspnea: Secondary | ICD-10-CM

## 2024-04-11 DIAGNOSIS — I2782 Chronic pulmonary embolism: Secondary | ICD-10-CM

## 2024-04-11 LAB — ECHOCARDIOGRAM COMPLETE: Area-P 1/2: 2.83 cm2

## 2024-04-12 DIAGNOSIS — Z9981 Dependence on supplemental oxygen: Secondary | ICD-10-CM | POA: Diagnosis not present

## 2024-04-13 NOTE — Telephone Encounter (Signed)
 Pt returning call

## 2024-04-17 ENCOUNTER — Ambulatory Visit: Admitting: Cardiology

## 2024-04-18 ENCOUNTER — Ambulatory Visit: Admitting: Cardiology

## 2024-04-19 ENCOUNTER — Encounter: Admitting: Family

## 2024-04-19 DIAGNOSIS — Z9981 Dependence on supplemental oxygen: Secondary | ICD-10-CM | POA: Diagnosis not present

## 2024-04-20 ENCOUNTER — Encounter: Admitting: Family

## 2024-04-26 DIAGNOSIS — I272 Pulmonary hypertension, unspecified: Secondary | ICD-10-CM | POA: Diagnosis not present

## 2024-04-26 DIAGNOSIS — J9611 Chronic respiratory failure with hypoxia: Secondary | ICD-10-CM | POA: Diagnosis not present

## 2024-04-26 DIAGNOSIS — Z91148 Patient's other noncompliance with medication regimen for other reason: Secondary | ICD-10-CM | POA: Diagnosis not present

## 2024-04-26 DIAGNOSIS — J449 Chronic obstructive pulmonary disease, unspecified: Secondary | ICD-10-CM | POA: Diagnosis not present

## 2024-05-02 ENCOUNTER — Other Ambulatory Visit: Payer: Self-pay | Admitting: Pulmonary Disease

## 2024-05-02 DIAGNOSIS — R0989 Other specified symptoms and signs involving the circulatory and respiratory systems: Secondary | ICD-10-CM

## 2024-05-02 DIAGNOSIS — R0600 Dyspnea, unspecified: Secondary | ICD-10-CM

## 2024-05-02 DIAGNOSIS — Z86711 Personal history of pulmonary embolism: Secondary | ICD-10-CM

## 2024-05-09 ENCOUNTER — Ambulatory Visit
Admission: RE | Admit: 2024-05-09 | Discharge: 2024-05-09 | Disposition: A | Discharge status: Home / Self Care | Source: Ambulatory Visit | Attending: Pulmonary Disease | Admitting: Pulmonary Disease

## 2024-05-09 ENCOUNTER — Encounter
Admission: RE | Admit: 2024-05-09 | Discharge: 2024-05-09 | Disposition: A | Discharge status: Home / Self Care | Source: Ambulatory Visit | Attending: Pulmonary Disease | Admitting: Pulmonary Disease

## 2024-05-09 DIAGNOSIS — R0689 Other abnormalities of breathing: Secondary | ICD-10-CM

## 2024-05-09 DIAGNOSIS — I50810 Right heart failure, unspecified: Secondary | ICD-10-CM | POA: Diagnosis not present

## 2024-05-09 DIAGNOSIS — I82812 Embolism and thrombosis of superficial veins of left lower extremities: Secondary | ICD-10-CM | POA: Diagnosis not present

## 2024-05-09 DIAGNOSIS — Z8261 Family history of arthritis: Secondary | ICD-10-CM | POA: Diagnosis not present

## 2024-05-09 DIAGNOSIS — Z87891 Personal history of nicotine dependence: Secondary | ICD-10-CM | POA: Diagnosis not present

## 2024-05-09 DIAGNOSIS — D6859 Other primary thrombophilia: Secondary | ICD-10-CM | POA: Diagnosis not present

## 2024-05-09 DIAGNOSIS — R0602 Shortness of breath: Secondary | ICD-10-CM | POA: Diagnosis not present

## 2024-05-09 DIAGNOSIS — R0989 Other specified symptoms and signs involving the circulatory and respiratory systems: Secondary | ICD-10-CM

## 2024-05-09 DIAGNOSIS — F32A Depression, unspecified: Secondary | ICD-10-CM | POA: Diagnosis not present

## 2024-05-09 DIAGNOSIS — I2699 Other pulmonary embolism without acute cor pulmonale: Secondary | ICD-10-CM | POA: Diagnosis not present

## 2024-05-09 DIAGNOSIS — J4489 Other specified chronic obstructive pulmonary disease: Secondary | ICD-10-CM | POA: Diagnosis not present

## 2024-05-09 DIAGNOSIS — E119 Type 2 diabetes mellitus without complications: Secondary | ICD-10-CM | POA: Diagnosis not present

## 2024-05-09 DIAGNOSIS — Z7901 Long term (current) use of anticoagulants: Secondary | ICD-10-CM | POA: Diagnosis not present

## 2024-05-09 DIAGNOSIS — Z86718 Personal history of other venous thrombosis and embolism: Secondary | ICD-10-CM | POA: Diagnosis not present

## 2024-05-09 DIAGNOSIS — K219 Gastro-esophageal reflux disease without esophagitis: Secondary | ICD-10-CM | POA: Diagnosis not present

## 2024-05-09 DIAGNOSIS — Z7951 Long term (current) use of inhaled steroids: Secondary | ICD-10-CM | POA: Diagnosis not present

## 2024-05-09 DIAGNOSIS — J9611 Chronic respiratory failure with hypoxia: Secondary | ICD-10-CM | POA: Diagnosis not present

## 2024-05-09 DIAGNOSIS — Z86711 Personal history of pulmonary embolism: Secondary | ICD-10-CM | POA: Diagnosis not present

## 2024-05-09 DIAGNOSIS — Z7984 Long term (current) use of oral hypoglycemic drugs: Secondary | ICD-10-CM | POA: Diagnosis not present

## 2024-05-09 DIAGNOSIS — I82412 Acute embolism and thrombosis of left femoral vein: Secondary | ICD-10-CM | POA: Diagnosis not present

## 2024-05-09 DIAGNOSIS — N179 Acute kidney failure, unspecified: Secondary | ICD-10-CM | POA: Diagnosis not present

## 2024-05-09 DIAGNOSIS — I10 Essential (primary) hypertension: Secondary | ICD-10-CM | POA: Diagnosis not present

## 2024-05-09 DIAGNOSIS — I2609 Other pulmonary embolism with acute cor pulmonale: Secondary | ICD-10-CM | POA: Diagnosis not present

## 2024-05-09 DIAGNOSIS — I2510 Atherosclerotic heart disease of native coronary artery without angina pectoris: Secondary | ICD-10-CM | POA: Diagnosis not present

## 2024-05-09 DIAGNOSIS — Z79899 Other long term (current) drug therapy: Secondary | ICD-10-CM | POA: Diagnosis not present

## 2024-05-09 DIAGNOSIS — R0600 Dyspnea, unspecified: Secondary | ICD-10-CM | POA: Diagnosis not present

## 2024-05-09 DIAGNOSIS — Z9981 Dependence on supplemental oxygen: Secondary | ICD-10-CM | POA: Diagnosis not present

## 2024-05-09 DIAGNOSIS — E785 Hyperlipidemia, unspecified: Secondary | ICD-10-CM | POA: Diagnosis not present

## 2024-05-09 DIAGNOSIS — G4733 Obstructive sleep apnea (adult) (pediatric): Secondary | ICD-10-CM | POA: Diagnosis not present

## 2024-05-09 DIAGNOSIS — M7121 Synovial cyst of popliteal space [Baker], right knee: Secondary | ICD-10-CM | POA: Diagnosis not present

## 2024-05-09 DIAGNOSIS — I82432 Acute embolism and thrombosis of left popliteal vein: Secondary | ICD-10-CM | POA: Diagnosis not present

## 2024-05-09 DIAGNOSIS — Z825 Family history of asthma and other chronic lower respiratory diseases: Secondary | ICD-10-CM | POA: Diagnosis not present

## 2024-05-09 MED ORDER — TECHNETIUM TO 99M ALBUMIN AGGREGATED
4.2600 | Freq: Once | INTRAVENOUS | Status: AC | PRN
  Administered 2024-05-09: 4.26 via INTRAVENOUS

## 2024-05-10 ENCOUNTER — Inpatient Hospital Stay
Admission: EM | Admit: 2024-05-10 | Discharge: 2024-05-12 | DRG: 164 | Disposition: A | Discharge status: Home / Self Care | Attending: Hospitalist | Admitting: Hospitalist

## 2024-05-10 ENCOUNTER — Emergency Department

## 2024-05-10 ENCOUNTER — Other Ambulatory Visit: Payer: Self-pay

## 2024-05-10 ENCOUNTER — Inpatient Hospital Stay

## 2024-05-10 DIAGNOSIS — E785 Hyperlipidemia, unspecified: Secondary | ICD-10-CM | POA: Diagnosis present

## 2024-05-10 DIAGNOSIS — I82409 Acute embolism and thrombosis of unspecified deep veins of unspecified lower extremity: Secondary | ICD-10-CM | POA: Insufficient documentation

## 2024-05-10 DIAGNOSIS — G4733 Obstructive sleep apnea (adult) (pediatric): Secondary | ICD-10-CM | POA: Diagnosis present

## 2024-05-10 DIAGNOSIS — Z7901 Long term (current) use of anticoagulants: Secondary | ICD-10-CM

## 2024-05-10 DIAGNOSIS — Z91148 Patient's other noncompliance with medication regimen for other reason: Secondary | ICD-10-CM

## 2024-05-10 DIAGNOSIS — I10 Essential (primary) hypertension: Secondary | ICD-10-CM | POA: Diagnosis present

## 2024-05-10 DIAGNOSIS — N950 Postmenopausal bleeding: Secondary | ICD-10-CM | POA: Diagnosis present

## 2024-05-10 DIAGNOSIS — I82412 Acute embolism and thrombosis of left femoral vein: Secondary | ICD-10-CM | POA: Diagnosis present

## 2024-05-10 DIAGNOSIS — I50810 Right heart failure, unspecified: Secondary | ICD-10-CM | POA: Diagnosis not present

## 2024-05-10 DIAGNOSIS — Z9981 Dependence on supplemental oxygen: Secondary | ICD-10-CM | POA: Diagnosis not present

## 2024-05-10 DIAGNOSIS — J4489 Other specified chronic obstructive pulmonary disease: Secondary | ICD-10-CM | POA: Diagnosis present

## 2024-05-10 DIAGNOSIS — G4730 Sleep apnea, unspecified: Secondary | ICD-10-CM | POA: Insufficient documentation

## 2024-05-10 DIAGNOSIS — Z86711 Personal history of pulmonary embolism: Secondary | ICD-10-CM

## 2024-05-10 DIAGNOSIS — Z825 Family history of asthma and other chronic lower respiratory diseases: Secondary | ICD-10-CM | POA: Diagnosis not present

## 2024-05-10 DIAGNOSIS — E119 Type 2 diabetes mellitus without complications: Secondary | ICD-10-CM | POA: Diagnosis present

## 2024-05-10 DIAGNOSIS — Z86718 Personal history of other venous thrombosis and embolism: Secondary | ICD-10-CM

## 2024-05-10 DIAGNOSIS — Z87891 Personal history of nicotine dependence: Secondary | ICD-10-CM

## 2024-05-10 DIAGNOSIS — N179 Acute kidney failure, unspecified: Secondary | ICD-10-CM | POA: Diagnosis present

## 2024-05-10 DIAGNOSIS — J9611 Chronic respiratory failure with hypoxia: Secondary | ICD-10-CM | POA: Diagnosis present

## 2024-05-10 DIAGNOSIS — Z79899 Other long term (current) drug therapy: Secondary | ICD-10-CM

## 2024-05-10 DIAGNOSIS — K219 Gastro-esophageal reflux disease without esophagitis: Secondary | ICD-10-CM | POA: Diagnosis present

## 2024-05-10 DIAGNOSIS — Z8261 Family history of arthritis: Secondary | ICD-10-CM | POA: Diagnosis not present

## 2024-05-10 DIAGNOSIS — F32A Depression, unspecified: Secondary | ICD-10-CM | POA: Diagnosis present

## 2024-05-10 DIAGNOSIS — I2510 Atherosclerotic heart disease of native coronary artery without angina pectoris: Secondary | ICD-10-CM | POA: Diagnosis present

## 2024-05-10 DIAGNOSIS — I82432 Acute embolism and thrombosis of left popliteal vein: Secondary | ICD-10-CM | POA: Diagnosis present

## 2024-05-10 DIAGNOSIS — Z7951 Long term (current) use of inhaled steroids: Secondary | ICD-10-CM

## 2024-05-10 DIAGNOSIS — I2699 Other pulmonary embolism without acute cor pulmonale: Secondary | ICD-10-CM | POA: Diagnosis present

## 2024-05-10 DIAGNOSIS — Z7984 Long term (current) use of oral hypoglycemic drugs: Secondary | ICD-10-CM

## 2024-05-10 DIAGNOSIS — D6859 Other primary thrombophilia: Secondary | ICD-10-CM | POA: Diagnosis present

## 2024-05-10 LAB — GLUCOSE, CAPILLARY: Glucose-Capillary: 141 mg/dL — ABNORMAL HIGH (ref 70–99)

## 2024-05-10 LAB — BASIC METABOLIC PANEL WITH GFR
Anion gap: 10 (ref 5–15)
BUN: 29 mg/dL — ABNORMAL HIGH (ref 8–23)
CO2: 25 mmol/L (ref 22–32)
Calcium: 8.7 mg/dL — ABNORMAL LOW (ref 8.9–10.3)
Chloride: 100 mmol/L (ref 98–111)
Creatinine, Ser: 1.33 mg/dL — ABNORMAL HIGH (ref 0.44–1.00)
GFR, Estimated: 44 mL/min — ABNORMAL LOW (ref >60)
Glucose, Bld: 188 mg/dL — ABNORMAL HIGH (ref 70–99)
Potassium: 4 mmol/L (ref 3.5–5.1)
Sodium: 135 mmol/L (ref 135–145)

## 2024-05-10 LAB — CBC
HCT: 41.6 % (ref 36.0–46.0)
Hemoglobin: 12.7 g/dL (ref 12.0–15.0)
MCH: 28.8 pg (ref 26.0–34.0)
MCHC: 30.5 g/dL (ref 30.0–36.0)
MCV: 94.3 fL (ref 80.0–100.0)
Platelets: 247 10*3/uL (ref 150–400)
RBC: 4.41 MIL/uL (ref 3.87–5.11)
RDW: 16.8 % — ABNORMAL HIGH (ref 11.5–15.5)
WBC: 7.7 10*3/uL (ref 4.0–10.5)
nRBC: 0 % (ref 0.0–0.2)

## 2024-05-10 LAB — APTT: aPTT: 48 s — ABNORMAL HIGH (ref 24–36)

## 2024-05-10 LAB — PROTIME-INR
INR: 1.5 — ABNORMAL HIGH (ref 0.8–1.2)
Prothrombin Time: 18.2 s — ABNORMAL HIGH (ref 11.4–15.2)

## 2024-05-10 LAB — TROPONIN I (HIGH SENSITIVITY)
Troponin I (High Sensitivity): 8 ng/L (ref <18)
Troponin I (High Sensitivity): 8 ng/L (ref <18)

## 2024-05-10 LAB — HEPARIN LEVEL (UNFRACTIONATED): Heparin Unfractionated: 0.18 [IU]/mL — ABNORMAL LOW (ref 0.30–0.70)

## 2024-05-10 MED ORDER — SODIUM CHLORIDE 0.9 % IV SOLN: INTRAVENOUS | Status: AC

## 2024-05-10 MED ORDER — FLUTICASONE FUROATE-VILANTEROL 200-25 MCG/ACT IN AEPB
1.0000 | INHALATION_SPRAY | Freq: Every day | RESPIRATORY_TRACT | Status: DC
  Filled 2024-05-10: qty 28

## 2024-05-10 MED ORDER — MELATONIN 5 MG PO TABS: 5.0000 mg | ORAL_TABLET | Freq: Every evening | ORAL | Status: DC | PRN

## 2024-05-10 MED ORDER — CEFAZOLIN SODIUM-DEXTROSE 2-4 GM/100ML-% IV SOLN
2.0000 g | INTRAVENOUS | Status: AC
  Administered 2024-05-11: 2 g via INTRAVENOUS
  Filled 2024-05-10: qty 100

## 2024-05-10 MED ORDER — INSULIN ASPART 100 UNIT/ML IJ SOLN: 0.0000 [IU] | Freq: Three times a day (TID) | INTRAMUSCULAR | Status: DC

## 2024-05-10 MED ORDER — INSULIN ASPART 100 UNIT/ML IJ SOLN: 0.0000 [IU] | Freq: Every day | INTRAMUSCULAR | Status: DC

## 2024-05-10 MED ORDER — FENOFIBRATE 160 MG PO TABS
160.0000 mg | ORAL_TABLET | Freq: Every day | ORAL | Status: DC
  Filled 2024-05-10 (×2): qty 1

## 2024-05-10 MED ORDER — ONDANSETRON HCL 4 MG/2ML IJ SOLN: 4.0000 mg | Freq: Four times a day (QID) | INTRAMUSCULAR | Status: DC | PRN

## 2024-05-10 MED ORDER — ACETAMINOPHEN 325 MG PO TABS
650.0000 mg | ORAL_TABLET | Freq: Four times a day (QID) | ORAL | Status: DC | PRN
Start: 2024-05-10 — End: 2024-05-12

## 2024-05-10 MED ORDER — ACETAMINOPHEN 650 MG RE SUPP: 650.0000 mg | Freq: Four times a day (QID) | RECTAL | Status: DC | PRN

## 2024-05-10 MED ORDER — HEPARIN BOLUS VIA INFUSION
5000.0000 [IU] | Freq: Once | INTRAVENOUS | Status: AC
  Administered 2024-05-10: 5000 [IU] via INTRAVENOUS
  Filled 2024-05-10: qty 5000

## 2024-05-10 MED ORDER — ONDANSETRON HCL 4 MG PO TABS: 4.0000 mg | ORAL_TABLET | Freq: Four times a day (QID) | ORAL | Status: DC | PRN

## 2024-05-10 MED ORDER — HEPARIN (PORCINE) 25000 UT/250ML-% IV SOLN
1700.0000 [IU]/h | INTRAVENOUS | Status: DC
  Administered 2024-05-10: 1400 [IU]/h via INTRAVENOUS
  Administered 2024-05-11: 1700 [IU]/h via INTRAVENOUS
  Filled 2024-05-10 (×2): qty 250

## 2024-05-10 MED ORDER — ESCITALOPRAM OXALATE 10 MG PO TABS
10.0000 mg | ORAL_TABLET | Freq: Every day | ORAL | Status: DC
  Administered 2024-05-10: 10 mg via ORAL
  Filled 2024-05-10 (×3): qty 1

## 2024-05-10 MED ORDER — PROPRANOLOL HCL 20 MG PO TABS
20.0000 mg | ORAL_TABLET | Freq: Every day | ORAL | Status: DC
  Administered 2024-05-11: 20 mg via ORAL
  Filled 2024-05-10: qty 1

## 2024-05-10 MED ORDER — POLYETHYLENE GLYCOL 3350 17 G PO PACK: 17.0000 g | PACK | Freq: Every day | ORAL | Status: DC | PRN

## 2024-05-10 MED ORDER — PANTOPRAZOLE SODIUM 40 MG PO TBEC
40.0000 mg | DELAYED_RELEASE_TABLET | Freq: Every day | ORAL | Status: DC
  Filled 2024-05-10 (×2): qty 1

## 2024-05-10 NOTE — H&P (Addendum)
 History and Physical    [name] ZOX:[id number] DOB: 05/08/1956 DOA: 05/10/2024  DOS: the patient was seen and examined on 05/10/2024  PCP: Freida Jes, FNP   Patient coming from: Home  I have personally briefly reviewed patient's old medical records in [place] [facility]  HPI:  Joanne Randall is a 68 y.o. year old female with past medical history of pulmonary embolism on home Pradaxa though noncompliant it is prescribed twice daily and patient reports that she takes once daily due to vaginal bleeding when taking as prescribed, chronic hypoxic respiratory failure on home O2 unclear she supposed to wear O2 at all times or as needed, stage III COPD, CAD, hyperlipidemia type 2 diabetes mellitus, depression, DVT, hypertension, obstructive sleep apnea, and previous tobacco use.  She presents to [facility] regional ED after outpatient VQ scan done yesterday consistent with pulmonary embolism.  She reports she has been experiencing shortness of breath for the past 6 months that has worsened significantly over the past week.  She reports dyspnea at rest that is worsened with exertion.  Denies chest pain, palpitations, nausea, vomiting, abdominal pain.  She reports she is largely sedentary secondary to her dyspnea and sits for extended periods of time.  Also of note she is supposed to wear 2 L O2 via nasal cannula at all times at home but she reports she only wears it sometimes at night.  ED Course: On arrival to [facility] regional ED patient was noted to be afebrile temp 36.6 C, BP 117/56, HR 76, RR 26, SpO2 85% on room air.  She was placed on 2 L via nasal cannula and SpO2 improved to 95%.  CXR obtained negative for any acute cardiopulmonary disease.  Labs notable for glucose 180, BUN 29, creatinine 1.33, troponin negative x2.  She was started on IV heparin and [facility] contacted for admission.  Review of Systems: As mentioned in the history of present illness. All other systems reviewed and are  negative.    Past Medical History:  Diagnosis Date   COPD (chronic obstructive pulmonary disease) (HCC)    Diabetes mellitus without complication (HCC)    DVT (deep venous thrombosis) (HCC)    Environmental allergies    H/O blood clots    Hypertension    Pulmonary embolism [facility]) 05/25/2012   [facility]    History reviewed. No pertinent surgical history.   reports that she quit smoking about 34 years ago. Her smoking use included cigarettes. She started smoking about 55 years ago. She has a 42 pack-year smoking history. She has never used smokeless tobacco. She reports that she does not drink alcohol and does not use drugs.  Allergies  Allergen Reactions   Aspirin     GI upset/Burns   Statins     myalgias    Family History  Problem Relation Age of Onset   Allergies Mother        smoker   Asthma Father        smoker   Emphysema Father    Allergies Daughter    Allergies Maternal Grandmother    Rheum arthritis Maternal Grandmother    Rheum arthritis Maternal Grandfather    Cancer Other        Maternal great grandmother    Prior to Admission medications   Medication Sig Start Date End Date Taking? Authorizing Provider  albuterol  (PROVENTIL ) (2.5 MG/3ML) 0.083% nebulizer solution 1 vial QID prn 10/18/12   [provider]  Budeson-Glycopyrrol-Formoterol (BREZTRI  AEROSPHERE) 160-9-4.8 MCG/ACT AERO  Inhale 2 puffs into the lungs in the morning and at bedtime. Patient not taking: Reported on 03/09/2024 03/05/21   Marc Senior, MD  budesonide-formoterol [facility]) 160-4.5 MCG/ACT inhaler Inhale 2 puffs into the lungs 2 (two) times daily. 02/14/24 02/13/25  [provider]  cephALEXin (KEFLEX) 500 MG capsule Take 500 mg by mouth 2 (two) times daily. Patient not taking: Reported on 03/09/2024    [provider]  dabigatran (PRADAXA) 150 MG CAPS capsule TAKE 1 CAPSULE BY MOUTH TWICE DAILY FOR BLOOD CLOT PREVENTION 06/02/16   [provider]  escitalopram (LEXAPRO) 10 MG tablet Take 10 mg by mouth daily. 09/09/22   [provider]  fenofibrate 160 MG tablet Take 160 mg by mouth daily.    [provider]  glipiZIDE (GLUCOTROL XL) 5 MG 24 hr tablet TAKE 1 TABLET BY MOUTH IN THE MORNING WITH FOOD 05/18/16   [provider]  losartan (COZAAR) 50 MG tablet Take 50 mg by mouth daily.    [provider]  pantoprazole (PROTONIX) 40 MG tablet Take 40 mg by mouth as needed.    [provider]  Potassium 99 MG TABS Take 1 tablet by mouth daily.    [provider]  propranolol (INDERAL) 20 MG tablet Take 20 mg by mouth daily. 12/16/12   [provider]  sAXagliptin HCl (ONGLYZA PO) Take by mouth. Patient not taking: Reported on 03/09/2024    [provider]  TRADJENTA 5 MG TABS tablet Take 5 mg by mouth daily.    [provider]  triamcinolone cream (KENALOG) 0.1 % Apply 1 application  topically 2 (two) times daily. Patient not taking: Reported on 01/26/2023    [provider]  Turmeric 500 MG CAPS Take 1 capsule by mouth 2 (two) times daily.    [provider]  ZOLOFT 50 MG tablet Take 25 mg by mouth daily. Patient not taking: Reported on 03/09/2024 01/10/13   [provider]    Physical Exam: Vitals:   05/10/24 1458 05/10/24 1600  BP: (!) 117/56 132/63  Pulse: 76 69  Resp: (!) 26 17  Temp: 97.9 F (36.6 C)   TempSrc: Oral   SpO2: (!) 85% 95%    Constitutional: Fatigued, obese, elderly Caucasian lady Eyes: PERRL, lids and conjunctivae normal Respiratory: Clear to auscultation bilaterally, no wheezing, no crackles. Normal respiratory effort. No accessory muscle use.  Conversational dyspnea Cardiovascular: Regular rate and rhythm, no murmurs / rubs / gallops. No extremity edema. 2+ radial and pedal pulses.   Abdomen: Soft, nontender. Bowel sounds positive x4 quadrants.  Musculoskeletal: No clubbing / cyanosis. No joint deformity  upper and lower extremities. Good ROM, no contractures. Normal muscle tone.  Skin: Warm, dry.  No rashes, lesions, ulcers.  Neurologic:  Alert and oriented x 3.     Labs on Admission: I have personally reviewed following labs and imaging studies  CBC: Recent Labs  Lab 05/10/24 1500  WBC 7.7  HGB 12.7  HCT 41.6  MCV 94.3  PLT 247   Basic Metabolic Panel: Recent Labs  Lab 05/10/24 1500  NA 135  K 4.0  CL 100  CO2 25  GLUCOSE 188*  BUN 29*  CREATININE 1.33*  CALCIUM 8.7*   GFR: CrCl cannot be calculated (Unknown ideal weight.). Liver Function Tests: No results for input(s): AST, ALT, ALKPHOS, BILITOT, PROT, ALBUMIN in the last 168 hours. No results for input(s): LIPASE, AMYLASE in the last 168 hours. No results for input(s): AMMONIA  in the last 168 hours. Coagulation Profile: Recent Labs  Lab 05/10/24 1607  INR 1.5*   Cardiac Enzymes: Recent Labs  Lab 05/10/24 1500  TROPONINIHS 8   BNP (last 3 results) No results for input(s): BNP in the last 8760 hours. HbA1C: No results for input(s): HGBA1C in the last 72 hours. CBG: No results for input(s): GLUCAP in the last 168 hours. Lipid Profile: No results for input(s): CHOL, HDL, LDLCALC, TRIG, CHOLHDL, LDLDIRECT in the last 72 hours. Thyroid  Function Tests: No results for input(s): TSH, T4TOTAL, FREET4, T3FREE, THYROIDAB in the last 72 hours. Anemia Panel: No results for input(s): VITAMINB12, FOLATE, FERRITIN, TIBC, IRON, RETICCTPCT in the last 72 hours. Urine analysis: No results found for: COLORURINE, APPEARANCEUR, LABSPEC, PHURINE, GLUCOSEU, HGBUR, BILIRUBINUR, KETONESUR, PROTEINUR, UROBILINOGEN, NITRITE, LEUKOCYTESUR  Radiological Exams on Admission: I have personally reviewed images DG Chest 2 View Result Date: 05/10/2024 CLINICAL DATA:  Worsening shortness of breath. EXAM: CHEST - 2 VIEW COMPARISON:  May 09, 2024 FINDINGS:  The heart size and mediastinal contours are within normal limits. Stable prominence of the bilateral hilar and perihilar pulmonary vasculature is seen. There is marked severity calcification of the aortic arch. There is no evidence of acute infiltrate, pleural effusion or pneumothorax. Multilevel degenerative changes are seen throughout the thoracic spine with mild levoscoliosis of the visualized portion of the upper lumbar spine. IMPRESSION: Stable exam without active cardiopulmonary disease. Electronically Signed   By: Virgle Grime M.D.   On: 05/10/2024 16:08   DG Chest 2 View Result Date: 05/10/2024 CLINICAL DATA:  Dyspnea.  History of diabetes and hypertension. EXAM: CHEST - 2 VIEW COMPARISON:  01/18/2013.  CT, 02/29/2024. FINDINGS: Cardiac silhouette is normal in size and configuration. Prominent pulmonary arteries, also stable. No mediastinal or hilar masses or evidence of adenopathy. Clear lungs.  No pleural effusion or pneumothorax. Skeletal structures are intact. IMPRESSION: No active cardiopulmonary disease. Electronically Signed   By: Amanda Jungling M.D.   On: 05/10/2024 10:26   NM Pulmonary Perfusion Result Date: 05/09/2024 CLINICAL DATA:  Short of breath for 2 weeks, remote history of pulmonary embolus and DVT EXAM: NUCLEAR MEDICINE PERFUSION LUNG SCAN TECHNIQUE: Perfusion images were obtained in multiple projections after intravenous injection of radiopharmaceutical. Ventilation scans intentionally deferred if perfusion scan and chest x-ray adequate for interpretation. RADIOPHARMACEUTICALS:  4.26 mCi Tc-52m MAA IV COMPARISON:  Chest x-ray 05/09/2024, CT 02/29/2024 FINDINGS: Planar images of the chest are obtained in multiple projections during the perfusion evaluation. There is heterogeneous decreased radiotracer uptake in multiple distributions throughout the bilateral lungs. Large mismatched perfusion defects in the upper lobes are noted, with no corresponding chest x-ray opacity, though  underlying emphysema in this region may account for the decreased perfusion. There is also a wedge-shaped perfusion defect within the posterior right lower lobe, without corresponding chest x-ray abnormality. Overall, findings are consistent with high probability exam utilizing PIOPED II criteria. IMPRESSION: 1. Findings consistent with high probability exam utilizing PIOPED II criteria, though sensitivity and specificity are decreased due to the lack of corresponding ventilation images and known underlying emphysema. These results will be called to the ordering clinician or representative by the Radiologist Assistant, and communication documented in the PACS or [employer]. Electronically Signed   By: Bobbye Burrow M.D.   On: 05/09/2024 14:52    Assessment and Plan: ##Recurrent pulmonary embolism ##Anticoagulation noncompliance VQ scan yesterday with findings consistent with high probability of PE PESI class II Symptoms: dyspnea at rest worsened with exertion No tachycardia, 2  L O2 requirement,  SPO2 95% - Heparin per pharmacy - ECHO - (B)LE DVT Study - Vascular surgery consulted, appreciate their recommendations and management  ##Renal insufficiency Creatinine 1.33, last available creatinine on 02/14/24 was 1.1 - Gentle IVF hydration-NS at 50 mL/h for 10 hours; monitor respiratory status closely - Avoid nephrotoxins, contrast Dyes, Hypotension and Dehydration  - Renally Adjust Meds - Continue to Monitor and Trend Renal Function carefully and repeat BMP in the AM   #COPD #Chronic hypoxic respiratory failure Per patient she supposed to wear 2 L O2 via nasal cannula around-the-clock however she does not feel symptomatic improvement when wearing the oxygen  so she only wears it at night - Supplemental O2 for goal SpO2 > 90% - Continue home Symbicort - As needed DuoNeb IM patient  # Type 2 diabetes mellitus On outpatient Tradjenta, glipizide - ACHS SSI and CBG monitoring while  inpatient - Check A1c  #Hypertension -Continue home propranolol - Hold home losartan with increased creatinine - As needed hydralazine  #Hyperlipidemia - Continue home fenofibrate  #GERD - Protonix  #Depression - Continue home Lexapro  #Sleep apnea Unable to tolerate CPAP -Supplemental O2 while sleeping for goal SpO2 > 90%   VTE prophylaxis: IV heparin  GI prophylaxis: Protonix Diet: Heart healthy/carb modified Access: PIV Lines: None Code Status: Full Telemetry: Yes Disposition: Admit to progressive  Admission status: Inpatient, progressive Patient is from: Home Anticipated d/c is to: Home Anticipated d/c date is: 1-2 days Patient currently: Hypoxic, on IV heparin  Family Communication: Discussed admission and plan of care with patient and daughter in law at bedside, questions welcomed and answered to their expressed satisfaction.  Consults called: Pulmonology   Severity of Illness: The appropriate patient status for this patient is INPATIENT. Inpatient status is judged to be reasonable and necessary in order to provide the required intensity of service to ensure the patient's safety. The patient's presenting symptoms, physical exam findings, and initial radiographic and laboratory data in the context of their chronic comorbidities is felt to place them at high risk for further clinical deterioration. Furthermore, it is not anticipated that the patient will be medically stable for discharge from the hospital within 2 midnights of admission.   * I certify that at the point of admission it is my clinical judgment that the patient will require inpatient hospital care spanning beyond 2 midnights from the point of admission due to high intensity of service, high risk for further deterioration and high frequency of surveillance required.*  To reach the provider On-Call:   7AM- 7PM see care teams to locate the attending and reach out to them via [email or web address]. Password:  [email or web address] 7PM-7AM contact night-coverage If you still have difficulty reaching the appropriate provider, please page the [facility] (Director on Call) for [facility] on amion for assistance  This document was prepared using Conservation officer, historic buildings and may include unintentional dictation errors.  Naida Austria FNP-BC, PMHNP-BC Nurse Practitioner [facility]

## 2024-05-10 NOTE — ED Provider Notes (Signed)
 [facility] Provider Note    Event Date/Time   First MD Initiated Contact with Patient 05/10/24 1533     (approximate)   History   Shortness of Breath (/)   HPI  Joanne Randall is a 68 y.o. female who presents to the emergency department today because of concern for pulmonary embolism seen on VQ scan performed yesterday.  Patient does have a history of blood clots.  Also has history of COPD.  Saw pulmonology earlier this month who ordered the VQ scan.  She states that over the past few weeks her breathing has gotten worse.  She does have oxygen  at home and uses it occasionally but states she feels it does not help.  Patient is on Pradaxa and while she initially states that she did not miss any doses later admitted that she in fact only takes one of her prescribed 2 doses each day.  The patient denies any chest pain.     Physical Exam   Triage Vital Signs: ED Triage Vitals   Encounter Vitals Group     BP (!) 117/56     Girls Systolic BP Percentile      Girls Diastolic BP Percentile      Boys Systolic BP Percentile      Boys Diastolic BP Percentile      Pulse Rate 76     Resp (!) 26     Temp 97.9 F (36.6 C)     Temp Source Oral     SpO2 (!) 85 %     Weight      Height      Head Circumference      Peak Flow      Pain Score 0     Pain Loc      Pain Education      Exclude from Growth Chart     Most recent vital signs: Vitals:   05/10/24 1458  BP: (!) 117/56  Pulse: 76  Resp: (!) 26  Temp: 97.9 F (36.6 C)  SpO2: (!) 85%   General: Awake, alert, oriented. CV:  Good peripheral perfusion. Regular rate and rhythm. Resp:  Normal effort. Lungs clear. Abd:  No distention.    ED Results / Procedures / Treatments   Labs (all labs ordered are listed, but only abnormal results are displayed) Labs Reviewed  BASIC METABOLIC PANEL WITH GFR - Abnormal; Notable for the following components:      Result Value   Glucose, Bld 188 (*)    BUN 29  (*)    Creatinine, Ser 1.33 (*)    Calcium 8.7 (*)    GFR, Estimated 44 (*)    All other components within normal limits  CBC - Abnormal; Notable for the following components:   RDW 16.8 (*)    All other components within normal limits  TROPONIN I (HIGH SENSITIVITY)     EKG  I, Marylynn Soho, attending physician, personally viewed and interpreted this EKG  EKG Time: 1459 Rate: 76 Rhythm: normal sinus rhythm Axis: right axis deviation Intervals: qtc 425 QRS: narrow ST changes: no st elevation Impression: abnormal ekg  RADIOLOGY I independently interpreted and visualized the CXR. My interpretation: No pneumonia Radiology interpretation:  IMPRESSION:  Stable exam without active cardiopulmonary disease.     PROCEDURES:  Critical Care performed: Yes  CRITICAL CARE Performed by: Marylynn Soho   Total critical care time: 30 minutes  Critical care time was exclusive of separately billable procedures and treating other  patients.  Critical care was necessary to treat or prevent imminent or life-threatening deterioration.  Critical care was time spent personally by me on the following activities: development of treatment plan with patient and/or surrogate as well as nursing, discussions with consultants, evaluation of patient's response to treatment, examination of patient, obtaining history from patient or surrogate, ordering and performing treatments and interventions, ordering and review of laboratory studies, ordering and review of radiographic studies, pulse oximetry and re-evaluation of patient's condition.   Procedures    MEDICATIONS ORDERED IN ED: Medications - No data to display   IMPRESSION / MDM / ASSESSMENT AND PLAN / ED COURSE  I reviewed the triage vital signs and the nursing notes.                              Differential diagnosis includes, but is not limited to, PE, medication non compliance, medication failure  Patient's presentation is most  consistent with acute presentation with potential threat to life or bodily function.   The patient is on the cardiac monitor to evaluate for evidence of arrhythmia and/or significant heart rate changes.  Patient presented to the emergency department today after outpatient imaging was concerning for PE.  Patient does complain of increasing shortness of breath over the past few weeks.  Patient has not been taking her medication as prescribed.  This time do think patient likely does have a new PE.  Will start IV heparin. Discussed with Foust NP with the hospitalist service who will evaluate for admission.  FINAL CLINICAL IMPRESSION(S) / ED DIAGNOSES   Final diagnoses:  Pulmonary embolism, other, unspecified chronicity, unspecified whether acute cor pulmonale present [facility]      Note:  This document was prepared using Dragon voice recognition software and may include unintentional dictation errors.    Marylynn Soho, MD 05/11/24 [phone]

## 2024-05-10 NOTE — ED Triage Notes (Signed)
 Pt to ED via POV from home. Pt reports increased SOB for a few weeks that has gotten worse in the last few days. Pt seen by pulmonologist and had CT scan done that was + for PE. Pt is on blood thinner. Pt with hx of COPD. Pt states is suppose to wear oxygen  all the time but does not make a difference. Pt oxygen  85% in triage. Pt placed on 2L and pt prefers to have cannula in her mouth.

## 2024-05-10 NOTE — Consult Note (Signed)
 PHARMACY - ANTICOAGULATION CONSULT NOTE  Pharmacy Consult for Heparin Indication: pulmonary embolus  Allergies  Allergen Reactions   Aspirin     GI upset/Burns   Statins     myalgias    Patient Measurements:    Vital Signs: Temp: 97.9 F (36.6 C) (06/19 1458) Temp Source: Oral (06/19 1458) BP: 132/63 (06/19 1600) Pulse Rate: 69 (06/19 1600)  Labs: Recent Labs    05/10/24 1500 05/10/24 1607  HGB 12.7  --   HCT 41.6  --   PLT 247  --   APTT  --  48*  LABPROT  --  18.2*  INR  --  1.5*  CREATININE 1.33*  --   TROPONINIHS 8  --     CrCl cannot be calculated (Unknown ideal weight.).   Medical History: Past Medical History:  Diagnosis Date   COPD (chronic obstructive pulmonary disease) (HCC)    Diabetes mellitus without complication (HCC)    DVT (deep venous thrombosis) (HCC)    Environmental allergies    H/O blood clots    Hypertension    Pulmonary embolism (HCC) 05/25/2012   [facility]    Medications:  Dabigatran 150mg  twice daily  Assessment: 68 y.o. female who presents to the emergency department today because of concern for pulmonary embolism seen on VQ scan performed yesterday.  Patient does have a history of blood clots. Patient admitted she takes dabigatran incorrectly at home, once daily vs the twice daily that she is prescribed. Pharmacy has been consulted to initiate and dose continuous heparin infusion.  Baseline labs: aPTT 48 sec, INR 1.5, Hgb 12.7, Plts 247  Goal of Therapy:  Heparin level 0.3-0.7 units/ml Monitor platelets by anticoagulation protocol: Yes   Plan:  Give 5000 units bolus x 1 Start heparin infusion at 1400 units/hr Check anti-Xa level in 6 hours and daily while on heparin Continue to monitor H&H and platelets  Vasti Yagi A Taquita Demby 05/10/2024,4:47 PM

## 2024-05-11 ENCOUNTER — Inpatient Hospital Stay
Admit: 2024-05-11 | Discharge: 2024-05-11 | Disposition: A | Discharge status: Home / Self Care | Attending: Internal Medicine | Admitting: Internal Medicine

## 2024-05-11 ENCOUNTER — Inpatient Hospital Stay: Admit: 2024-05-11

## 2024-05-11 ENCOUNTER — Encounter
Admission: EM | Disposition: A | Discharge status: Home / Self Care | Payer: Self-pay | Source: Home / Self Care | Attending: Hospitalist

## 2024-05-11 ENCOUNTER — Inpatient Hospital Stay

## 2024-05-11 DIAGNOSIS — I2699 Other pulmonary embolism without acute cor pulmonale: Secondary | ICD-10-CM | POA: Diagnosis not present

## 2024-05-11 DIAGNOSIS — I50810 Right heart failure, unspecified: Secondary | ICD-10-CM

## 2024-05-11 DIAGNOSIS — I82412 Acute embolism and thrombosis of left femoral vein: Secondary | ICD-10-CM | POA: Diagnosis not present

## 2024-05-11 HISTORY — PX: PULMONARY THROMBECTOMY: CATH118295

## 2024-05-11 LAB — BASIC METABOLIC PANEL WITH GFR
Anion gap: 9 (ref 5–15)
BUN: 22 mg/dL (ref 8–23)
CO2: 28 mmol/L (ref 22–32)
Calcium: 8.5 mg/dL — ABNORMAL LOW (ref 8.9–10.3)
Chloride: 104 mmol/L (ref 98–111)
Creatinine, Ser: 1 mg/dL (ref 0.44–1.00)
GFR, Estimated: >60 mL/min (ref >60)
Glucose, Bld: 107 mg/dL — ABNORMAL HIGH (ref 70–99)
Potassium: 3.9 mmol/L (ref 3.5–5.1)
Sodium: 141 mmol/L (ref 135–145)

## 2024-05-11 LAB — CBC
HCT: 40.6 % (ref 36.0–46.0)
Hemoglobin: 12.5 g/dL (ref 12.0–15.0)
MCH: 28.9 pg (ref 26.0–34.0)
MCHC: 30.8 g/dL (ref 30.0–36.0)
MCV: 94 fL (ref 80.0–100.0)
Platelets: 221 10*3/uL (ref 150–400)
RBC: 4.32 MIL/uL (ref 3.87–5.11)
RDW: 16.7 % — ABNORMAL HIGH (ref 11.5–15.5)
WBC: 6.5 10*3/uL (ref 4.0–10.5)
nRBC: 0 % (ref 0.0–0.2)

## 2024-05-11 LAB — HEMOGLOBIN A1C
Hgb A1c MFr Bld: 6 % — ABNORMAL HIGH (ref 4.8–5.6)
Mean Plasma Glucose: 125.5 mg/dL

## 2024-05-11 LAB — ECHOCARDIOGRAM COMPLETE
AR max vel: 2.31 cm2
AV Area VTI: 1.9 cm2
AV Area mean vel: 2.13 cm2
AV Mean grad: 2 mmHg
AV Peak grad: 3.5 mmHg
Ao pk vel: 0.93 m/s
Area-P 1/2: 3.5 cm2
Height: 66.5 in
MV VTI: 1.25 cm2
S' Lateral: 2.7 cm
Weight: 3716.8 [oz_av]

## 2024-05-11 LAB — MRSA NEXT GEN BY PCR, NASAL: MRSA by PCR Next Gen: NOT DETECTED

## 2024-05-11 LAB — GLUCOSE, CAPILLARY
Glucose-Capillary: 92 mg/dL (ref 70–99)
Glucose-Capillary: 93 mg/dL (ref 70–99)
Glucose-Capillary: 98 mg/dL (ref 70–99)
Glucose-Capillary: 160 mg/dL — ABNORMAL HIGH (ref 70–99)
Glucose-Capillary: 124 mg/dL — ABNORMAL HIGH (ref 70–99)

## 2024-05-11 LAB — HIV ANTIBODY (ROUTINE TESTING W REFLEX): HIV Screen 4th Generation wRfx: NONREACTIVE

## 2024-05-11 LAB — HEPARIN LEVEL (UNFRACTIONATED)
Heparin Unfractionated: 0.37 [IU]/mL (ref 0.30–0.70)
Heparin Unfractionated: 0.38 [IU]/mL (ref 0.30–0.70)

## 2024-05-11 SURGERY — PULMONARY THROMBECTOMY
Anesthesia: Moderate Sedation | Laterality: Bilateral

## 2024-05-11 MED ORDER — MIDAZOLAM HCL 2 MG/2ML IJ SOLN
INTRAMUSCULAR | Status: AC
  Filled 2024-05-11: qty 2

## 2024-05-11 MED ORDER — CHLORHEXIDINE GLUCONATE CLOTH 2 % EX PADS
6.0000 | MEDICATED_PAD | Freq: Every day | CUTANEOUS | Status: DC
  Administered 2024-05-11 – 2024-05-12 (×2): 6 via TOPICAL

## 2024-05-11 MED ORDER — HEPARIN SODIUM (PORCINE) 1000 UNIT/ML IJ SOLN
INTRAMUSCULAR | Status: AC
Start: 2024-05-11 — End: 2024-05-11
  Filled 2024-05-11: qty 10

## 2024-05-11 MED ORDER — HEPARIN (PORCINE) 25000 UT/250ML-% IV SOLN
1700.0000 [IU]/h | INTRAVENOUS | Status: DC
  Administered 2024-05-11 – 2024-05-12 (×2): 1700 [IU]/h via INTRAVENOUS
  Filled 2024-05-11: qty 250

## 2024-05-11 MED ORDER — PHENYLEPHRINE HCL-NACL 20-0.9 MG/250ML-% IV SOLN
INTRAVENOUS | Status: AC
  Filled 2024-05-11: qty 250

## 2024-05-11 MED ORDER — HEPARIN (PORCINE) 25000 UT/250ML-% IV SOLN
INTRAVENOUS | Status: AC
Start: 2024-05-11 — End: 2024-05-11
  Filled 2024-05-11: qty 250

## 2024-05-11 MED ORDER — CHLORHEXIDINE GLUCONATE CLOTH 2 % EX PADS: 6.0000 | MEDICATED_PAD | Freq: Every day | CUTANEOUS | Status: DC

## 2024-05-11 MED ORDER — HEPARIN (PORCINE) IN NACL 1000-0.9 UT/500ML-% IV SOLN
INTRAVENOUS | Status: DC | PRN
  Administered 2024-05-11: 500 mL

## 2024-05-11 MED ORDER — MIDAZOLAM HCL 2 MG/2ML IJ SOLN
INTRAMUSCULAR | Status: DC | PRN
  Administered 2024-05-11: 1 mg via INTRAVENOUS
  Administered 2024-05-11 (×2): .5 mg via INTRAVENOUS

## 2024-05-11 MED ORDER — PHENYLEPHRINE 80 MCG/ML (10ML) SYRINGE FOR IV PUSH (FOR BLOOD PRESSURE SUPPORT)
PREFILLED_SYRINGE | INTRAVENOUS | Status: AC
Start: 2024-05-11 — End: 2024-05-11
  Filled 2024-05-11: qty 10

## 2024-05-11 MED ORDER — FENTANYL CITRATE (PF) 100 MCG/2ML IJ SOLN
INTRAMUSCULAR | Status: AC
  Filled 2024-05-11: qty 2

## 2024-05-11 MED ORDER — ALTEPLASE 2 MG IJ SOLR
INTRAMUSCULAR | Status: AC
Start: 2024-05-11 — End: 2024-05-11
  Filled 2024-05-11: qty 6

## 2024-05-11 MED ORDER — ALTEPLASE 1 MG/ML SYRINGE FOR VASCULAR PROCEDURE
INTRAMUSCULAR | Status: DC | PRN
  Administered 2024-05-11 (×2): 3 mg via INTRA_ARTERIAL

## 2024-05-11 MED ORDER — PHENYLEPHRINE HCL (PRESSORS) 10 MG/ML IV SOLN
INTRAVENOUS | Status: AC
Start: 2024-05-11 — End: 2024-05-11
  Filled 2024-05-11: qty 1

## 2024-05-11 MED ORDER — FENTANYL CITRATE (PF) 100 MCG/2ML IJ SOLN
INTRAMUSCULAR | Status: DC | PRN
  Administered 2024-05-11: 25 ug via INTRAVENOUS
  Administered 2024-05-11: 12.5 ug via INTRAVENOUS

## 2024-05-11 MED ORDER — STERILE WATER FOR INJECTION IJ SOLN
INTRAMUSCULAR | Status: AC
  Filled 2024-05-11: qty 10

## 2024-05-11 MED ORDER — HEPARIN BOLUS VIA INFUSION
2500.0000 [IU] | Freq: Once | INTRAVENOUS | Status: AC
  Administered 2024-05-11: 2500 [IU] via INTRAVENOUS
  Filled 2024-05-11: qty 2500

## 2024-05-11 MED ORDER — HEPARIN SODIUM (PORCINE) 1000 UNIT/ML IJ SOLN
INTRAMUSCULAR | Status: DC | PRN
  Administered 2024-05-11: 4000 [IU] via INTRAVENOUS

## 2024-05-11 MED ORDER — SODIUM CHLORIDE (PF) 0.9 % IJ SOLN
INTRAMUSCULAR | Status: DC | PRN
  Administered 2024-05-11: 500 mL via INTRAVENOUS

## 2024-05-11 MED ORDER — LIDOCAINE HCL (PF) 1 % IJ SOLN
INTRAMUSCULAR | Status: DC | PRN
  Administered 2024-05-11: 10 mL via INTRADERMAL

## 2024-05-11 SURGICAL SUPPLY — 28 items
CANISTER PENUMBRA ENGINE (MISCELLANEOUS) IMPLANT
CATH ANGIO 5F 100CM .035 PIG (CATHETERS) IMPLANT
CATH INDIGO SEP 8 (CATHETERS) IMPLANT
CATH INFINITI JR4 5F (CATHETERS) IMPLANT
CATH LIGHTNING 8 XTORQ 115 (CATHETERS) IMPLANT
CATH SELECT BERN TIP 5F 130 (CATHETERS) IMPLANT
CLOSURE PERCLOSE PROSTYLE (VASCULAR PRODUCTS) IMPLANT
COVER DRAPE FLUORO 36X44 (DRAPES) IMPLANT
COVER PROBE ULTRASOUND 5X96 (MISCELLANEOUS) IMPLANT
DEVICE TORQUE (MISCELLANEOUS) IMPLANT
DRAPE TABLE BACK 80X90 (DRAPES) IMPLANT
GLIDEWIRE ADV .035X260CM (WIRE) IMPLANT
GLIDEWIRE ANGLED SS 035X260CM (WIRE) IMPLANT
KIT FEM OPTION ELITE FILTER (Filter) IMPLANT
NDL ENTRY 21GA 7CM ECHOTIP (NEEDLE) IMPLANT
NEEDLE ENTRY 21GA 7CM ECHOTIP (NEEDLE) ×1 IMPLANT
PACK ANGIOGRAPHY (CUSTOM PROCEDURE TRAY) ×1 IMPLANT
PACK CUSTOM I.R. DRAPE (CUSTOM PROCEDURE TRAY) IMPLANT
PANNUS RETENTION SYSTEM 2 PAD (MISCELLANEOUS) IMPLANT
SET INTRO CAPELLA COAXIAL (SET/KITS/TRAYS/PACK) IMPLANT
SHEATH 9FRX11 (SHEATH) IMPLANT
SHEATH BRITE TIP 6FRX11 (SHEATH) IMPLANT
SUT MNCRL AB 4-0 PS2 18 (SUTURE) IMPLANT
SUT PROLENE 0 CT 1 30 (SUTURE) IMPLANT
SYR MEDRAD MARK 7 150ML (SYRINGE) IMPLANT
TUBING CONTRAST HIGH PRESS 72 (TUBING) IMPLANT
WIRE AMPLATZ SSTIFF .035X260CM (WIRE) IMPLANT
WIRE J 3MM .035X145CM (WIRE) IMPLANT
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
## 2024-05-11 NOTE — Progress Notes (Signed)
*  PRELIMINARY RESULTS* Echocardiogram 2D Echocardiogram has been performed.  Joanne Randall 05/11/2024, 11:09 AM

## 2024-05-11 NOTE — Plan of Care (Signed)

## 2024-05-11 NOTE — H&P (View-Only) (Signed)
 Joanne Randall is a 68 yo female who is known to have pulmonary embolisms with left lower extremity DVT.  We had previous conversation this morning regarding the need for pulmonary thrombectomy with IVC filter placement.  Patient has thought about it and spoken to family members and wishes to proceed at this time.  Patient continues to be on a heparin infusion at this time.  I discussed again in detail with the patient the procedures, benefits, risk, and complications.  Patient verbalizes understanding wishes to proceed.  I answered all her questions today.  I again discussed with the patient she will need anticoagulation post the procedure.  She will need to be compliant with this anticoagulation medication to ensure that the outcome of this procedure remains successful.  I also discussed with the patient the need to follow-up with OB/GYN for her vaginal bleeding as she will need to be on anticoagulation long-term.  Patient verbalizes her understanding.  Patient has remained n.p.o. since midnight last night for the procedure later today.  Heparin infusion will remain running until the patient goes to her procedure.

## 2024-05-11 NOTE — Consult Note (Signed)
 PHARMACY - ANTICOAGULATION CONSULT NOTE  Pharmacy Consult for Heparin Indication: pulmonary embolus  Allergies  Allergen Reactions   Aspirin     GI upset/Burns   Statins     myalgias    Patient Measurements: Height: 5' 6.5 (168.9 cm) Weight: 105.4 kg (232 lb 4.8 oz) IBW/kg (Calculated) : 60.45 HEPARIN DW (KG): 84.5  Vital Signs: Temp: 97.8 F (36.6 C) (06/19 1933) Temp Source: Oral (06/19 1933) BP: 111/59 (06/19 1933) Pulse Rate: 80 (06/19 1933)  Labs: Recent Labs    05/10/24 1500 05/10/24 1607 05/10/24 1700 05/10/24 2319 05/11/24 0527  HGB 12.7  --   --   --  12.5  HCT 41.6  --   --   --  40.6  PLT 247  --   --   --  221  APTT  --  48*  --   --   --   LABPROT  --  18.2*  --   --   --   INR  --  1.5*  --   --   --   HEPARINUNFRC  --   --   --  0.18* 0.37  CREATININE 1.33*  --   --   --  1.00  TROPONINIHS 8  --  8  --   --     Estimated Creatinine Clearance: 67.7 mL/min (by C-G formula based on SCr of 1 mg/dL).   Medical History: Past Medical History:  Diagnosis Date   COPD (chronic obstructive pulmonary disease) (HCC)    Diabetes mellitus without complication (HCC)    DVT (deep venous thrombosis) (HCC)    Environmental allergies    H/O blood clots    Hypertension    Pulmonary embolism (HCC) 05/25/2012   [facility]    Medications:  Dabigatran 150mg  twice daily  Assessment: 68 y.o. female who presents to the emergency department today because of concern for pulmonary embolism seen on VQ scan performed yesterday.  Patient does have a history of blood clots. Patient admitted she takes dabigatran incorrectly at home, once daily vs the twice daily that she is prescribed. Pharmacy has been consulted to initiate and dose continuous heparin infusion.  Baseline labs: aPTT 48 sec, INR 1.5, Hgb 12.7, Plts 247  Goal of Therapy:  Heparin level 0.3-0.7 units/ml Monitor platelets by anticoagulation protocol: Yes   Plan:  6/20:  HL @ 0527 = 0.37,  therapeutic X 1 - will continue pt on current rate and recheck HL in 6 hrs Continue to monitor H&H and platelets  Marga Gramajo D 05/11/2024,5:58 AM

## 2024-05-11 NOTE — Plan of Care (Addendum)
 Patient and family requesting to speak to someone (OR probably best) to move forward with procedure (set for 11ish). Jullie Oiler been informed that patient's mom died from a similar procedure and they need reassurance and education about process/risks. Patient will not even discuss consent till this occurs! Also, US  of bilat LE hasn't been done either.  All Drs notified and will round as needed/able.  US  contacted and stated, we attempted to complete on 6/19, but patient refused.  Transport being sent, since patient agrees to US  this morning - but ONLY to R/O LE clots so she can go home.  4034 - Patient back from LE US  and looks to me results indicate x2 clots in left LE.  Patient currently talking with PCP and family (on speaker) about situation and options.  I also added to the conversation what I noted in the results.  Patient is obviously just scared, but weighing options.  We shall see??  0935 PCP suggested and had patient agree to watch video about the procedure and talk with Dr. some more. Family found filter placement video on google (LOL), but I know things are different with each case and dont want to confuse the situation by showing her the wrong info. Requested OK from surgeons to show found video or possibly 1 they would recommend.  go ahead before I do. PLease note - The patient DOES seems to be considering consenting, i- just needs a little more info/time. The plan is for Dr. Prescilla Brod to round after he finishes current procedure in the OR.  Patient and family informed.  1005 - I was very clear that every situation/patient is different - so I would NOT be able to answer specfics about her procedure. THAT will need to be Dr. Prescilla Brod :) Just had another discussion - Patient understands/agrees that the procedure and home medications go TOGETHER. She knows she will have to take meds as instructed. She is afraid of Elequis and xeralto, but wants to consider taking pradaxa correctly and as prescribed.  However, also realizes she got these existing clots while on it??    NO videos were shown by me - per Brian's thoughts.  Just didn't want to risk passing along unnecessary/wrong info, not applicable to her body/situation.  1100  Jerry/ECHO did heavy education while completing echo. Patient has a few questions still - but has signed consent :)

## 2024-05-11 NOTE — Op Note (Signed)
 [facility]  Percutaneous Study/Intervention Procedural Note   Date of Surgery: 05/11/2024,6:05 PM  Surgeon:Sherrilynn Gudgel, Ninette Basque   Pre-operative Diagnosis: Symptomatic pulmonary emboli with right heart strain and hypoxia  Post-operative diagnosis:  Same  Procedure(s) Performed:  1.  Contrast injection right heart and bilateral pulmonary arteries  2.  Thrombolysis bilateral pulmonary arteries with 6 mg of TPA  3.  Mechanical thrombectomy bilateral lobar pulmonary arteries for removal of pulmonary emboli using the Penumbra CAT 8 thrombectomy catheter.  4.  Selective catheter placement right upper lobe pulmonary artery, middle lobe pulmonary artery and lower lobe pulmonary artery  5.  Selective catheter placement left upper lobe pulmonary artery and lower lobe pulmonary artery    Anesthesia: Conscious sedation was administered under my direct supervision by the interventional radiology RN. IV Versed plus fentanyl were utilized. Continuous ECG, pulse oximetry and blood pressure was monitored throughout the entire procedure.  Versed and fentanyl were administered intravenously.  Conscious sedation was administered for a total of 115 minutes.  Sheath: 9 French 11 cm Pinnacle antegrade right common femoral vein  Contrast: 55 cc   Fluoroscopy Time: 26.2 minutes  Indications:  Patient presents with pulmonary emboli. The patient is symptomatic with hypoxemia and dyspnea on exertion.  There is evidence of right heart strain on the CT angiogram. The patient is otherwise a good candidate for intervention and even the long-term benefits pulmonary angiography with thrombolysis is offered. The risks and benefits are reviewed long-term benefits are discussed. All questions are answered patient agrees to proceed.  Procedure:  Joanne Randall a 68 y.o. female who was identified and appropriate procedural time out was performed.  The patient was then placed supine on the table and  prepped and draped in the usual sterile fashion.  Ultrasound was used to evaluate the right common femoral vein.  It was patent, as it was echolucent and compressible.  A digital ultrasound image was acquired for the permanent record.  A micropuncture needle was used to access the right common femoral vein under direct ultrasound guidance.  A microwire was then advanced under fluoroscopic guidance followed by micro-sheath.  A 0.035 J wire was advanced without resistance and a 5Fr sheath was placed.  Perclose devices were then used in a preclose fashion and then upsized to an 9 [place] sheath.    The wire and pigtail catheter were then negotiated into the right atrium and bolus injection of contrast was utilized to demonstrate the right ventricle and the pulmonary artery outflow.   4000 units of heparin was then given and allowed to circulate.  TPA was reconstituted and delivered onto the table. A total of 6 milligrams of TPA was utilized.  3 mg was administered on the left side and 3 mg was administered on the right side. This was then allowed to dwell for 20-30 minutes.  The J-wire and pigtail catheter was advanced up to the right atrium where a bolus injection contrast was used to demonstrate the pulmonary artery outflow.  Stiff angled Glidewire was then exchanged for the J-wire and the pigtail catheter was used to select the pulmonary outflow track.  The right main pulmonary artery was evaluated first.  A select catheter was then advanced over the Amplatz wire into the distal right main pulmonary artery and hand-injection confirmed the thrombus.  3 mg of tPA was then injected directly into the thrombus within the right distal main pulmonary artery.  After an appropriate dwell time the Amplatz wire was reintroduced through  the select catheter and the select catheter removed.  The Penumbra Cat 8 extra torque catheter was then advanced into the thrombus in the right lower lobe pulmonary artery.   Hand-injection contrast was used to verify positioning and evaluate the distal anatomy.  Mechanical aspiration was performed using the CAT 8 catheter and a separator.  After multiple passes the catheter was then repositioned into the right middle lobe pulmonary artery and again hand-injection contrast was performed to verify position and evaluate the distal anatomy.  Multiple passes were made using mechanical aspiration in association with a separator.  Lastly, using a combination of the separator catheter and Glidewire the CAT 8 device was negotiated into the right upper lobe pulmonary artery.  Hand-injection of contrast was used to verify positioning and evaluate the distal anatomy.  Multiple passes were then performed using the separator with the CAT 8 penumbra catheter.  Once there was free flow of blood from all 3 lobar arteries the catheter was repositioned to the right main pulmonary artery.  Hand-injection contrast was then used to give an assessment of the effectiveness of thrombectomy.  Satisfied with the thrombectomy on the right, I then used the penumbra CAT 8 device as well as a Glidewire and an angled catheter to select the right main pulmonary artery.  The catheter was then advanced into the left main pulmonary artery.  Then with the catheter in the left main pulmonary artery bolus injection contrast was utilized to demonstrate the thrombus as well as the segmental pulmonary artery vasculature. This demonstrated thrombus in the distal left main pulmonary artery extending into the left upper lobe artery as well as the left lower lobe artery and noting that it was near occlusive.  The catheter was then advanced out so that it was positioned within the thrombus and 3 mg of TPA were infused directly into the clot.  After an appropriate dwell time the Amplatz wire was reintroduced through the pigtail catheter and the pigtail catheter removed.  The Penumbra Cat 8 extra torque catheter was then  advanced into the thrombus in the left lower lobe pulmonary artery.  Hand-injection contrast was used to verify the positioning and evaluate the distal anatomy.  Mechanical aspiration was performed using the CAT 8  catheter and a separator.  After multiple passes the catheter was then repositioned into the left upper lobe pulmonary artery and again hand-injection contrast was performed to verify positioning and evaluate the distal anatomy.  Multiple passes were made using mechanical aspiration in association with a separator.  Once there was free flow of blood from both lobar arteries the catheter was repositioned to the left main pulmonary artery.  Hand-injection contrast was then performed to give an assessment of the left pulmonary vasculature and the effectiveness of thrombectomy.  The catheter was then reintroduced over the Amplatz wire after removing the penumbra catheter and positioned in the pulmonary outflow tract.  A bolus injection of contrast was then used to create a final image of the pulmonary vasculature.  After review these images the catheter and sheath were removed and pressure held. There were no immediate complications.    Findings:   Right heart imaging:  Right atrium and right ventricle and the pulmonary outflow tract appears normal  Right lung: The initial images of the right lung demonstrate thrombus within the distal right main pulmonary artery extending into the right upper, middle and lower lobar arteries.  There is thrombus extending into the segmental branches as well.  Following thrombectomy there  appears to be near total resolution of the previously identified thrombus.  Left lung:  The initial images of the left lung demonstrate thrombus within the distal left main pulmonary artery extending into the left upper and lower lobar arteries.  There is thrombus extending into the segmental branches as well.  Following thrombectomy there appears to be near total resolution of the  previously identified thrombus.    Disposition: Patient was taken to the recovery room in stable condition having tolerated the procedure well.  Joanne Randall 05/11/2024,6:05 PM

## 2024-05-11 NOTE — Consult Note (Signed)
 PHARMACY - ANTICOAGULATION CONSULT NOTE  Pharmacy Consult for Heparin Indication: pulmonary embolus  Allergies  Allergen Reactions   Aspirin     GI upset/Burns   Statins     myalgias    Patient Measurements: Height: 5' 6.5 (168.9 cm) Weight: 105.4 kg (232 lb 4.8 oz) IBW/kg (Calculated) : 60.45 HEPARIN DW (KG): 84.5  Vital Signs: Temp: 97.8 F (36.6 C) (06/20 1150) BP: 128/79 (06/20 1150) Pulse Rate: 76 (06/20 1150)  Labs: Recent Labs    05/10/24 1500 05/10/24 1607 05/10/24 1700 05/10/24 2319 05/11/24 0527 05/11/24 1144  HGB 12.7  --   --   --  12.5  --   HCT 41.6  --   --   --  40.6  --   PLT 247  --   --   --  221  --   APTT  --  48*  --   --   --   --   LABPROT  --  18.2*  --   --   --   --   INR  --  1.5*  --   --   --   --   HEPARINUNFRC  --   --   --  0.18* 0.37 0.38  CREATININE 1.33*  --   --   --  1.00  --   TROPONINIHS 8  --  8  --   --   --     Estimated Creatinine Clearance: 67.7 mL/min (by C-G formula based on SCr of 1 mg/dL).   Medical History: Past Medical History:  Diagnosis Date   COPD (chronic obstructive pulmonary disease) (HCC)    Diabetes mellitus without complication (HCC)    DVT (deep venous thrombosis) (HCC)    Environmental allergies    H/O blood clots    Hypertension    Pulmonary embolism (HCC) 05/25/2012   [facility]    Medications:  Dabigatran 150mg  twice daily  Assessment: 68 y.o. female who presents to the emergency department today because of concern for pulmonary embolism seen on VQ scan performed yesterday.  Patient does have a history of blood clots. Patient admitted she takes dabigatran incorrectly at home, once daily vs the twice daily that she is prescribed. Pharmacy has been consulted to initiate and dose continuous heparin infusion.  Baseline labs: aPTT 48 sec, INR 1.5, Hgb 12.7, Plts 247  Date Time Results Comments 6/20 0527 HL 0.37 Therapeutic x 1 , rate 1400 units'hr 6/20 1144 HL 0.38 Therapeutic x  2   Goal of Therapy:  Heparin level 0.3-0.7 units/ml Monitor platelets by anticoagulation protocol: Yes   Plan:  HL remains therapeutic at current rate. Will continue infusion at 1400 units/hr. Repeat HL with AM labs Continue to monitor H&H and platelets   Kamarri Fischetti Rodriguez-Guzman PharmD, BCPS 05/11/2024 1:12 PM

## 2024-05-11 NOTE — Consult Note (Signed)
 PHARMACY - ANTICOAGULATION CONSULT NOTE  Pharmacy Consult for Heparin Indication: pulmonary embolus  Allergies  Allergen Reactions   Aspirin     GI upset/Burns   Statins     myalgias    Patient Measurements: Height: 5' 6.5 (168.9 cm) Weight: 105.4 kg (232 lb 4.8 oz) IBW/kg (Calculated) : 60.45 HEPARIN DW (KG): 84.5  Vital Signs: Temp: 96.5 F (35.8 C) (06/20 1805) Temp Source: Axillary (06/20 1805) BP: 108/51 (06/20 1805) Pulse Rate: 70 (06/20 1805)  Labs: Recent Labs    05/10/24 1500 05/10/24 1607 05/10/24 1700 05/10/24 2319 05/11/24 0527 05/11/24 1144  HGB 12.7  --   --   --  12.5  --   HCT 41.6  --   --   --  40.6  --   PLT 247  --   --   --  221  --   APTT  --  48*  --   --   --   --   LABPROT  --  18.2*  --   --   --   --   INR  --  1.5*  --   --   --   --   HEPARINUNFRC  --   --   --  0.18* 0.37 0.38  CREATININE 1.33*  --   --   --  1.00  --   TROPONINIHS 8  --  8  --   --   --     Estimated Creatinine Clearance: 67.7 mL/min (by C-G formula based on SCr of 1 mg/dL).   Medical History: Past Medical History:  Diagnosis Date   COPD (chronic obstructive pulmonary disease) (HCC)    Diabetes mellitus without complication (HCC)    DVT (deep venous thrombosis) (HCC)    Environmental allergies    H/O blood clots    Hypertension    Pulmonary embolism (HCC) 05/25/2012   [facility]    Medications:  Dabigatran 150mg  twice daily  Assessment: 68 y.o. female who presents to the emergency department today because of concern for pulmonary embolism seen on VQ scan performed yesterday.  Patient does have a history of blood clots. Patient admitted she takes dabigatran incorrectly at home, once daily vs the twice daily that she is prescribed. Pharmacy has been consulted to initiate and dose continuous heparin infusion.  Baseline labs: aPTT 48 sec, INR 1.5, Hgb 12.7, Plts 247  Date Time Results Comments 6/20 0527 HL 0.37 Therapeutic x 1 , rate 1400  units'hr 6/20 1144 HL 0.38 Therapeutic x 2   Goal of Therapy:  Heparin level 0.3-0.7 units/ml Monitor platelets by anticoagulation protocol: Yes   Plan:  Per Dr. Prescilla Brod resume heparin infusion at previous rate of 1700 units/hr at 6/20 @ 1845 Recheck HL 6 hours after resumption of drip Continue to monitor H&H and platelets   Ramonita Burow PharmD, BCPS 05/11/2024 6:16 PM

## 2024-05-11 NOTE — Progress Notes (Signed)
 Transition of Care [facility]) - Inpatient Brief Assessment   Patient Details  Name: Joanne Randall MRN: [id number] Date of Birth: Apr 29, 1956  Transition of Care [facility]) CM/SW Contact:    Iridessa Harrow C Cordarrel Stiefel, RN Phone Number: 05/11/2024, 3:30 PM   Clinical Narrative: Transition of Care Department [facility]) has reviewed patient. We will continue to monitor patient advancement. If new patient transition needs arise, please place a TOC consult.   Transition of Care Asessment: Insurance and Status: Insurance coverage has been reviewed Patient has primary care physician: Yes   Prior level of function:: independent Prior/Current Home Services: No current home services Social Drivers of Health Review: SDOH reviewed no interventions necessary Readmission risk has been reviewed: Yes Transition of care needs: no transition of care needs at this time

## 2024-05-11 NOTE — Progress Notes (Signed)
  PROGRESS NOTE    Joanne Randall  ZOX:[id number] DOB: [birth year] DOA: 05/10/2024 PCP: Freida Jes, FNP  ARCL/NONE  LOS: 1 day   Brief hospital course:   Assessment & Plan: Joanne Randall is a 68 y.o. year old female with past medical history of pulmonary embolism on home Pradaxa though noncompliant it is prescribed twice daily and patient reports that she takes once daily due to vaginal bleeding when taking as prescribed, chronic hypoxic respiratory failure on home O2 unclear she supposed to wear O2 at all times or as needed, stage III COPD, CAD, hyperlipidemia type 2 diabetes mellitus, depression, DVT, hypertension, obstructive sleep apnea, and previous tobacco use.  She presents to [facility] ED after outpatient VQ scan done yesterday consistent with pulmonary embolism.    # Recurrent pulmonary embolism # LE DVT # Anticoagulation noncompliance VQ scan yesterday with findings consistent with high probability of PE PESI class II Symptoms: dyspnea at rest worsened with exertion No tachycardia, 2 L O2 requirement,  SPO2 95% --US  Positive for mild residual nonocclusive thrombus in the left femoral and popliteal veins. --thrombectomy with vascular today --cont heparin gtt   # Renal insufficiency Creatinine 1.33, last available creatinine on 02/14/24 was 1.1 --Cr improved back to baseline after MIVF --oral hydration now   #COPD #Chronic hypoxic respiratory failure Per patient she supposed to wear 2 L O2 via nasal cannula around-the-clock however she does not feel symptomatic improvement when wearing the oxygen  so she only wears it at night --cont daily bronchodilators  # Type 2 diabetes mellitus On outpatient Tradjenta, glipizide --A1c 6.0, well controlled --d/c BG checks and SSI   #Hypertension -hold home propranolol and losartan   #Hyperlipidemia - Continue home fenofibrate   #GERD - Protonix   #Depression - Continue home Lexapro   #Sleep apnea Unable to  tolerate CPAP -Supplemental O2 while sleeping for goal SpO2 > 90%   DVT prophylaxis: [id number] gtt Code Status: Full code  Family Communication:  Level of care: Progressive Dispo:   The patient is from: home Anticipated d/c is to: home Anticipated d/c date is: tomorrow   Subjective and Interval History:  Dyspnea improved.  Pt going for thrombectomy today.   Objective: Vitals:   05/11/24 1755 05/11/24 1758 05/11/24 1805 05/11/24 1815  BP:   (!) 108/51 (!) 90/55  Pulse:   70 69  Resp: 11 11 12 12   Temp:   (!) 96.5 F (35.8 C)   TempSrc:   Axillary   SpO2: 98% 98% 93% 92%  Weight:      Height:        Intake/Output Summary (Last 24 hours) at 05/11/2024 1820 Last data filed at 05/11/2024 1530 Gross per 24 hour  Intake 807.24 ml  Output --  Net 807.24 ml   Filed Weights   05/10/24 1649  Weight: 105.4 kg    Examination:   Constitutional: NAD, AAOx3 HEENT: conjunctivae and lids normal, EOMI CV: No cyanosis.   RESP: normal respiratory effort, on RA Neuro: II - XII grossly intact.   Psych: Normal mood and affect.  Appropriate judgement and reason   Data Reviewed: I have personally reviewed labs and imaging studies  Time spent: 50 minutes  Garrison Kanner, MD [facility] If 7PM-7AM, please contact night-coverage 05/11/2024, 6:20 PM

## 2024-05-11 NOTE — Progress Notes (Signed)
 Joanne Randall is a 68 yo female who is known to have pulmonary embolisms with left lower extremity DVT.  We had previous conversation this morning regarding the need for pulmonary thrombectomy with IVC filter placement.  Patient has thought about it and spoken to family members and wishes to proceed at this time.  Patient continues to be on a heparin infusion at this time.  I discussed again in detail with the patient the procedures, benefits, risk, and complications.  Patient verbalizes understanding wishes to proceed.  I answered all her questions today.  I again discussed with the patient she will need anticoagulation post the procedure.  She will need to be compliant with this anticoagulation medication to ensure that the outcome of this procedure remains successful.  I also discussed with the patient the need to follow-up with OB/GYN for her vaginal bleeding as she will need to be on anticoagulation long-term.  Patient verbalizes her understanding.  Patient has remained n.p.o. since midnight last night for the procedure later today.  Heparin infusion will remain running until the patient goes to her procedure.

## 2024-05-11 NOTE — Interval H&P Note (Signed)
 History and Physical Interval Note:  05/11/2024 3:47 PM  Joanne Randall  has presented today for surgery, with the diagnosis of Pulmonary Embolism.  The various methods of treatment have been discussed with the patient and family. After consideration of risks, benefits and other options for treatment, the patient has consented to  Procedure(s): PULMONARY THROMBECTOMY (Bilateral) as a surgical intervention.  The patient's history has been reviewed, patient examined, no change in status, stable for surgery.  I have reviewed the patient's chart and labs.  Questions were answered to the patient's satisfaction.     Devon Fogo

## 2024-05-12 DIAGNOSIS — I2699 Other pulmonary embolism without acute cor pulmonale: Secondary | ICD-10-CM | POA: Diagnosis not present

## 2024-05-12 LAB — CBC
HCT: 40.2 % (ref 36.0–46.0)
Hemoglobin: 11.8 g/dL — ABNORMAL LOW (ref 12.0–15.0)
MCH: 28.6 pg (ref 26.0–34.0)
MCHC: 29.4 g/dL — ABNORMAL LOW (ref 30.0–36.0)
MCV: 97.6 fL (ref 80.0–100.0)
Platelets: 219 10*3/uL (ref 150–400)
RBC: 4.12 MIL/uL (ref 3.87–5.11)
RDW: 17.1 % — ABNORMAL HIGH (ref 11.5–15.5)
WBC: 7.4 10*3/uL (ref 4.0–10.5)
nRBC: 0.3 % — ABNORMAL HIGH (ref 0.0–0.2)

## 2024-05-12 LAB — BASIC METABOLIC PANEL WITH GFR
Anion gap: 8 (ref 5–15)
BUN: 22 mg/dL (ref 8–23)
CO2: 26 mmol/L (ref 22–32)
Calcium: 8.3 mg/dL — ABNORMAL LOW (ref 8.9–10.3)
Chloride: 104 mmol/L (ref 98–111)
Creatinine, Ser: 0.97 mg/dL (ref 0.44–1.00)
GFR, Estimated: >60 mL/min (ref >60)
Glucose, Bld: 121 mg/dL — ABNORMAL HIGH (ref 70–99)
Potassium: 4.3 mmol/L (ref 3.5–5.1)
Sodium: 138 mmol/L (ref 135–145)

## 2024-05-12 LAB — MAGNESIUM: Magnesium: 2.2 mg/dL (ref 1.7–2.4)

## 2024-05-12 LAB — HEPARIN LEVEL (UNFRACTIONATED)
Heparin Unfractionated: 0.5 [IU]/mL (ref 0.30–0.70)
Heparin Unfractionated: 0.46 [IU]/mL (ref 0.30–0.70)

## 2024-05-12 MED ORDER — APIXABAN (ELIQUIS) VTE STARTER PACK (10MG AND 5MG): ORAL_TABLET | ORAL | 0 refills | Status: AC

## 2024-05-12 MED ORDER — APIXABAN 5 MG PO TABS: 5.0000 mg | ORAL_TABLET | Freq: Two times a day (BID) | ORAL | 1 refills | Status: DC

## 2024-05-12 MED ORDER — APIXABAN 5 MG PO TABS
10.0000 mg | ORAL_TABLET | Freq: Two times a day (BID) | ORAL | Status: DC
  Administered 2024-05-12: 10 mg via ORAL
  Filled 2024-05-12: qty 2

## 2024-05-12 MED ORDER — APIXABAN 5 MG PO TABS: 5.0000 mg | ORAL_TABLET | Freq: Two times a day (BID) | ORAL | 1 refills | Status: AC

## 2024-05-12 MED ORDER — APIXABAN 5 MG PO TABS: 5.0000 mg | ORAL_TABLET | Freq: Two times a day (BID) | ORAL | Status: DC

## 2024-05-12 NOTE — Discharge Summary (Incomplete)
 Physician Discharge Summary   Joanne Randall  female DOB: 1956/10/25  FMW:[id number]  PCP: Orlando Dwayne NOVAK, FNP  Admit date: 05/10/2024 Discharge date: 05/12/2024  Admitted From: home Disposition:  home CODE STATUS: Full code  Discharge Instructions     Discharge instructions   Complete by: As directed    You have recurrent significant blood clots in your lungs that likely came from blood clots in your legs.  You need to be on blood thinner indefinitely.  I have prescribed you Eliquis .  Please finish the 1st month of starter pack, and then take 5 mg twice daily.  3 months of Eliquis  prescribed.  Please follow up with your PCP for further refills.  If you develop vaginal bleeding while on Eliquis , you need to see ObGyn. [facility] Course:  For full details, please see H&P, progress notes, consult notes and ancillary notes.  Briefly,  Joanne Randall is a 68 y.o. year old female with past medical history of pulmonary embolism and DVT on Pradaxa though noncompliant due to vaginal bleeding, chronic hypoxic respiratory failure on home 2L O2, stage III COPD, CAD, type 2 diabetes mellitus, hypertension, obstructive sleep apnea, and previous tobacco use.  She presented to [facility] regional ED after outpatient VQ scan done the day prior was consistent with pulmonary embolism.   Of note, pt had had prior PE and DVT, and said she had tried all different kind of anticoagulation but all resulted in vaginal bleeding, so pt had been taking her Pradaxa once daily instead of BID as prescribed, which then did not result in vaginal bleeding.  Pt has never seen an ObGyn for her vaginal bleeding.     # Recurrent pulmonary embolism # LE DVT # Anticoagulation noncompliance S/p thrombectomy on 05/11/24 VQ scan yesterday with findings consistent with high probability of PE PESI class II Symptoms: dyspnea at rest worsened with exertion.  No tachycardia. --US  Positive for mild residual  nonocclusive thrombus in the left femoral and popliteal veins. --pt was started on heparin  gtt, and received thrombectomy with vascular surgery.  Dyspnea improved after thrombectomy. --Pradaxa d/c'ed, pt was discharged on Eliquis .  Abnormal vaginal bleeding --while on anticoagulation.   --advised pt to follow up with ObGyn to find the etiology of her post-menopausal vaginal bleeding.   # AKI --Cr 1.33 on presentation, improved with MIVF.  Cr 0.97 prior to discharge.   #COPD #Chronic hypoxic respiratory failure on 2L Per patient she supposed to wear 2 L O2 via nasal cannula around-the-clock however she does not feel symptomatic improvement when wearing the oxygen  so she only wears it at night --cont daily bronchodilators   # Type 2 diabetes mellitus On outpatient Tradjenta, glipizide --A1c 6.0, well controlled   #Hypertension -home propranolol  and losartan held during hospitalization, to be resumed after discharge.   #Hyperlipidemia - Continue home fenofibrate    #GERD - Protonix    #Depression - Continue home Lexapro  --not taking Zoloft PTA   #Sleep apnea Unable to tolerate CPAP   Discharge Diagnoses:  Principal Problem:   Pulmonary embolism (HCC) Active Problems:   Asthma with COPD (HCC)   Coronary artery disease   Hyperlipidemia   Controlled type 2 diabetes mellitus without complication, without long-term current use of insulin  (HCC)   Depression   Essential hypertension   Sleep apnea   DVT of deep femoral vein, left (HCC)   30 Day Unplanned Readmission Risk Score    Flowsheet Row ED to Hosp-Admission (Current) from  05/10/2024 in [facility] REGIONAL CARDIAC MED PCU  30 Day Unplanned Readmission Risk Score (%) 14.18 Filed at 05/12/2024 0800    This score is the patient's risk of an unplanned readmission within 30 days of being discharged (0 -100%). The score is based on dignosis, age, lab data, medications, orders, and past utilization.   Low:  0-14.9   Medium:  15-21.9   High: 22-29.9   Extreme: 30 and above         Discharge Instructions:  Allergies as of 05/12/2024       Reactions   Aspirin    GI upset/Burns   Statins    myalgias        Medication List     STOP taking these medications    dabigatran 150 MG Caps capsule Commonly known as: PRADAXA   Potassium 99 MG Tabs   triamcinolone cream 0.1 % Commonly known as: KENALOG   Zoloft 50 MG tablet Generic drug: sertraline       TAKE these medications    Acetylcysteine 600 MG Caps Take 600 mg by mouth. Take 1 capsule (600 mg total) by mouth 3 (three) times daily   albuterol  (2.5 MG/3ML) 0.083% nebulizer solution Commonly known as: PROVENTIL  1 vial QID prn   Apixaban  Starter Pack (10mg  and 5mg ) Commonly known as: ELIQUIS  STARTER PACK Take as directed on package: start with two-5mg  tablets twice daily for 7 days. On day 8, switch to one-5mg  tablet twice daily.   apixaban  5 MG Tabs tablet Commonly known as: ELIQUIS  Take 1 tablet (5 mg total) by mouth 2 (two) times daily. Start taking on: June 11, 2024   budesonide-formoterol 160-4.5 MCG/ACT inhaler Commonly known as: SYMBICORT Inhale 2 puffs into the lungs 2 (two) times daily.   Cyanocobalamin 2500 MCG Subl Place under the tongue. Place under the tongue   escitalopram  10 MG tablet Commonly known as: LEXAPRO  Take 10 mg by mouth daily.   fenofibrate  160 MG tablet Take 160 mg by mouth daily.   glipiZIDE 10 MG 24 hr tablet Commonly known as: GLUCOTROL XL Take 10 mg by mouth daily.   ipratropium 0.03 % nasal spray Commonly known as: ATROVENT Place 1 spray into the nose. Place 1 spray into both nostrils 2 (two) times daily as needed for Rhinitis   losartan 50 MG tablet Commonly known as: COZAAR Take 50 mg by mouth daily.   pantoprazole  40 MG tablet Commonly known as: PROTONIX  Take 40 mg by mouth as needed.   propranolol  20 MG tablet Commonly known as: INDERAL  Take 20 mg by mouth daily.    Tradjenta 5 MG Tabs tablet Generic drug: linagliptin Take 5 mg by mouth daily.   triamcinolone 55 MCG/ACT Aero nasal inhaler Commonly known as: NASACORT Place 1 spray into the nose. Place 1 spray into both nostrils 2 (two) times daily   TURMERIC (CURCUMIN) PO Take 2 capsules by mouth at bedtime.   Turmeric 500 MG Caps Take 1 capsule by mouth 2 (two) times daily.         Follow-up Information     Orlando Dwayne NOVAK, FNP Follow up in 1 week(s).   Specialty: Nurse Practitioner Contact information: [address] [place] [address] [phone]                 Allergies  Allergen Reactions   Aspirin     GI upset/Burns   Statins     myalgias     The results of significant diagnostics from this hospitalization (  including imaging, microbiology, ancillary and laboratory) are listed below for reference.   Consultations:   Procedures/Studies: ECHOCARDIOGRAM COMPLETE Result Date: 05/11/2024    ECHOCARDIOGRAM REPORT   Patient Name:   [facility] Date of Exam: 05/11/2024 Medical Rec #:  [id number]      Height:       66.5 in Accession #:    [id number]     Weight:       232.3 lb Date of Birth:  [birth year]     BSA:          2.143 m Patient Age:    67 years       BP:           108/62 mmHg Patient Gender: F              HR:           76 bpm. Exam Location:  [facility] Procedure: 2D Echo, Cardiac Doppler and Color Doppler (Both Spectral and Color            Flow Doppler were utilized during procedure). Indications:     Pulmonary embolus I26.09  History:         Patient has prior history of Echocardiogram examinations, most                  recent 04/11/2024. COPD; Risk Factors:Hypertension. DVT.  Sonographer:     Christopher Furnace Referring Phys:  [id number] KATY L FOUST Diagnosing Phys: Cara JONETTA Lovelace MD IMPRESSIONS  1. Left ventricular ejection fraction, by estimation, is 55 to 60%. The left ventricle has normal function. The left ventricle has no regional wall motion abnormalities. There  is mild concentric left ventricular hypertrophy. Left ventricular diastolic parameters are consistent with Grade I diastolic dysfunction (impaired relaxation). There is the interventricular septum is flattened in systole and diastole, consistent with right ventricular pressure and volume overload.  2. Right ventricular systolic function is severely reduced. The right ventricular size is severely enlarged. Mildly increased right ventricular wall thickness.  3. The mitral valve is grossly normal. Mild mitral valve regurgitation.  4. Tricuspid valve regurgitation is mild to moderate.  5. The aortic valve is calcified. Aortic valve regurgitation is trivial. Aortic valve sclerosis/calcification is present, without any evidence of aortic stenosis. FINDINGS  Left Ventricle: Left ventricular ejection fraction, by estimation, is 55 to 60%. The left ventricle has normal function. The left ventricle has no regional wall motion abnormalities. Strain was performed and the global longitudinal strain is indeterminate. Global longitudinal strain performed but not reported based on interpreter judgement due to suboptimal tracking. The left ventricular internal cavity size was normal in size. There is mild concentric left ventricular hypertrophy. The interventricular septum is flattened in systole and diastole, consistent with right ventricular pressure and volume overload. Left ventricular diastolic parameters are consistent with Grade I diastolic dysfunction (impaired relaxation). Right Ventricle: The right ventricular size is severely enlarged. Mildly increased right ventricular wall thickness. Right ventricular systolic function is severely reduced. Left Atrium: Left atrial size was normal in size. Right Atrium: Right atrial size was normal in size. Pericardium: There is no evidence of pericardial effusion. Mitral Valve: The mitral valve is grossly normal. Mild mitral valve regurgitation. MV peak gradient, 4.8 mmHg. The mean  mitral valve gradient is 2.0 mmHg. Tricuspid Valve: The tricuspid valve is grossly normal. Tricuspid valve regurgitation is mild to moderate. Aortic Valve: The aortic valve is calcified. Aortic valve regurgitation is trivial. Aortic valve sclerosis/calcification is present, without any evidence  of aortic stenosis. Aortic valve mean gradient measures 2.0 mmHg. Aortic valve peak gradient measures 3.5 mmHg. Aortic valve area, by VTI measures 1.90 cm. Pulmonic Valve: The pulmonic valve was grossly normal. Pulmonic valve regurgitation is not visualized. Aorta: The ascending aorta was not well visualized. IAS/Shunts: No atrial level shunt detected by color flow Doppler. Additional Comments: 3D was performed not requiring image post processing on an independent workstation and was indeterminate.  LEFT VENTRICLE PLAX 2D LVIDd:         3.80 cm   Diastology LVIDs:         2.70 cm   LV e' medial:    4.46 cm/s LV PW:         1.00 cm   LV E/e' medial:  14.1 LV IVS:        1.30 cm   LV e' lateral:   8.27 cm/s LVOT diam:     2.00 cm   LV E/e' lateral: 7.6 LV SV:         34 LV SV Index:   16 LVOT Area:     3.14 cm  RIGHT VENTRICLE RV Basal diam:  5.30 cm RV Mid diam:    4.60 cm RV S prime:     11.20 cm/s TAPSE (M-mode): 2.3 cm LEFT ATRIUM           Index        RIGHT ATRIUM           Index LA diam:      2.60 cm 1.21 cm/m   RA Area:     30.60 cm LA Vol (A4C): 29.2 ml 13.62 ml/m  RA Volume:   120.00 ml 55.99 ml/m  AORTIC VALVE AV Area (Vmax):    2.31 cm AV Area (Vmean):   2.13 cm AV Area (VTI):     1.90 cm AV Vmax:           93.25 cm/s AV Vmean:          64.900 cm/s AV VTI:            0.180 m AV Peak Grad:      3.5 mmHg AV Mean Grad:      2.0 mmHg LVOT Vmax:         68.70 cm/s LVOT Vmean:        44.000 cm/s LVOT VTI:          0.109 m LVOT/AV VTI ratio: 0.61  AORTA Ao Root diam: 2.90 cm MITRAL VALVE               TRICUSPID VALVE MV Area (PHT): 3.50 cm    TR Peak grad:   62.4 mmHg MV Area VTI:   1.25 cm    TR Vmax:         395.00 cm/s MV Peak grad:  4.8 mmHg MV Mean grad:  2.0 mmHg    SHUNTS MV Vmax:       1.09 m/s    Systemic VTI:  0.11 m MV Vmean:      65.3 cm/s   Systemic Diam: 2.00 cm MV Decel Time: 217 msec MV E velocity: 62.90 cm/s MV A velocity: 91.20 cm/s MV E/A ratio:  0.69 Dwayne D Callwood MD Electronically signed by Cara JONETTA Lovelace MD Signature Date/Time: 05/11/2024/6:33:27 PM    Final    PERIPHERAL VASCULAR CATHETERIZATION Result Date: 05/11/2024 See surgical note for result.  US  Venous Img Lower Bilateral (DVT) Result Date: 05/11/2024 CLINICAL DATA:  Pulmonary embolism,  assess for DVT EXAM: BILATERAL LOWER EXTREMITY VENOUS DOPPLER ULTRASOUND TECHNIQUE: Gray-scale sonography with graded compression, as well as color Doppler and duplex ultrasound were performed to evaluate the lower extremity deep venous systems from the level of the common femoral vein and including the common femoral, femoral, profunda femoral, popliteal and calf veins including the posterior tibial, peroneal and gastrocnemius veins when visible. The superficial great saphenous vein was also interrogated. Spectral Doppler was utilized to evaluate flow at rest and with distal augmentation maneuvers in the common femoral, femoral and popliteal veins. COMPARISON:  None Available. FINDINGS: RIGHT LOWER EXTREMITY Common Femoral Vein: No evidence of thrombus. Normal compressibility, respiratory phasicity and response to augmentation. Saphenofemoral Junction: No evidence of thrombus. Normal compressibility and flow on color Doppler imaging. Profunda Femoral Vein: No evidence of thrombus. Normal compressibility and flow on color Doppler imaging. Femoral Vein: No evidence of thrombus. Normal compressibility, respiratory phasicity and response to augmentation. Popliteal Vein: No evidence of thrombus. Normal compressibility, respiratory phasicity and response to augmentation. Calf Veins: No evidence of thrombus. Normal compressibility and flow on color  Doppler imaging. Superficial Great Saphenous Vein: No evidence of thrombus. Normal compressibility. Venous Reflux:  None. Other Findings: Mildly complex fluid collection in the popliteal fossa measures 4.7 x 1.1 x 2.1 cm. LEFT LOWER EXTREMITY Common Femoral Vein: No evidence of thrombus. Normal compressibility, respiratory phasicity and response to augmentation. Saphenofemoral Junction: Thrombus present within the great saphenous vein extending into the saphenofemoral junction. Profunda Femoral Vein: No evidence of thrombus. Normal compressibility and flow on color Doppler imaging. Femoral Vein: Non occlusive thrombus present within the femoral vein in the mid and distal thigh. Popliteal Vein: Non occlusive eccentric thrombus present within the popliteal vein resulting in partial compressibility. Calf Veins: No evidence of thrombus. Normal compressibility and flow on color Doppler imaging. Superficial Great Saphenous Vein: No evidence of thrombus. Normal compressibility. Venous Reflux:  None. Other Findings:  None. IMPRESSION: 1. Positive for mild residual nonocclusive thrombus in the left femoral and popliteal veins. 2. No evidence of DVT in the right lower extremity. 3. Incidental note is made of a small right-sided Baker's cyst. Electronically Signed   By: Wilkie Lent M.D.   On: 05/11/2024 08:51   DG Chest 2 View Result Date: 05/10/2024 CLINICAL DATA:  Worsening shortness of breath. EXAM: CHEST - 2 VIEW COMPARISON:  May 09, 2024 FINDINGS: The heart size and mediastinal contours are within normal limits. Stable prominence of the bilateral hilar and perihilar pulmonary vasculature is seen. There is marked severity calcification of the aortic arch. There is no evidence of acute infiltrate, pleural effusion or pneumothorax. Multilevel degenerative changes are seen throughout the thoracic spine with mild levoscoliosis of the visualized portion of the upper lumbar spine. IMPRESSION: Stable exam without active  cardiopulmonary disease. Electronically Signed   By: Suzen Dials M.D.   On: 05/10/2024 16:08   DG Chest 2 View Result Date: 05/10/2024 CLINICAL DATA:  Dyspnea.  History of diabetes and hypertension. EXAM: CHEST - 2 VIEW COMPARISON:  01/18/2013.  CT, 02/29/2024. FINDINGS: Cardiac silhouette is normal in size and configuration. Prominent pulmonary arteries, also stable. No mediastinal or hilar masses or evidence of adenopathy. Clear lungs.  No pleural effusion or pneumothorax. Skeletal structures are intact. IMPRESSION: No active cardiopulmonary disease. Electronically Signed   By: Alm Parkins M.D.   On: 05/10/2024 10:26   NM Pulmonary Perfusion Result Date: 05/09/2024 CLINICAL DATA:  Short of breath for 2 weeks, remote history of pulmonary embolus and DVT  EXAM: NUCLEAR MEDICINE PERFUSION LUNG SCAN TECHNIQUE: Perfusion images were obtained in multiple projections after intravenous injection of radiopharmaceutical. Ventilation scans intentionally deferred if perfusion scan and chest x-ray adequate for interpretation. RADIOPHARMACEUTICALS:  4.26 mCi Tc-62m MAA IV COMPARISON:  Chest x-ray 05/09/2024, CT 02/29/2024 FINDINGS: Planar images of the chest are obtained in multiple projections during the perfusion evaluation. There is heterogeneous decreased radiotracer uptake in multiple distributions throughout the bilateral lungs. Large mismatched perfusion defects in the upper lobes are noted, with no corresponding chest x-ray opacity, though underlying emphysema in this region may account for the decreased perfusion. There is also a wedge-shaped perfusion defect within the posterior right lower lobe, without corresponding chest x-ray abnormality. Overall, findings are consistent with high probability exam utilizing PIOPED II criteria. IMPRESSION: 1. Findings consistent with high probability exam utilizing PIOPED II criteria, though sensitivity and specificity are decreased due to the lack of corresponding  ventilation images and known underlying emphysema. These results will be called to the ordering clinician or representative by the Radiologist Assistant, and communication documented in the PACS or [employer]. Electronically Signed   By: Ozell Daring M.D.   On: 05/09/2024 14:52      Labs: BNP (last 3 results) No results for input(s): BNP in the last 8760 hours. Basic Metabolic Panel: Recent Labs  Lab 05/10/24 1500 05/11/24 0527 05/12/24 0057  NA 135 141 138  K 4.0 3.9 4.3  CL 100 104 104  CO2 25 28 26   GLUCOSE 188* 107* 121*  BUN 29* 22 22  CREATININE 1.33* 1.00 0.97  CALCIUM 8.7* 8.5* 8.3*  MG  --   --  2.2   Liver Function Tests: No results for input(s): AST, ALT, ALKPHOS, BILITOT, PROT, ALBUMIN  in the last 168 hours. No results for input(s): LIPASE, AMYLASE in the last 168 hours. No results for input(s): AMMONIA in the last 168 hours. CBC: Recent Labs  Lab 05/10/24 1500 05/11/24 0527 05/12/24 0057  WBC 7.7 6.5 7.4  HGB 12.7 12.5 11.8*  HCT 41.6 40.6 40.2  MCV 94.3 94.0 97.6  PLT 247 221 219   Cardiac Enzymes: No results for input(s): CKTOTAL, CKMB, CKMBINDEX, TROPONINI in the last 168 hours. BNP: Invalid input(s): POCBNP CBG: Recent Labs  Lab 05/11/24 0354 05/11/24 0904 05/11/24 1152 05/11/24 1541 05/11/24 1811  GLUCAP 92 93 98 160* 124*   D-Dimer No results for input(s): DDIMER in the last 72 hours. Hgb A1c Recent Labs    05/11/24 0527  HGBA1C 6.0*   Lipid Profile No results for input(s): CHOL, HDL, LDLCALC, TRIG, CHOLHDL, LDLDIRECT in the last 72 hours. Thyroid  function studies No results for input(s): TSH, T4TOTAL, T3FREE, THYROIDAB in the last 72 hours.  Invalid input(s): FREET3 Anemia work up No results for input(s): VITAMINB12, FOLATE, FERRITIN, TIBC, IRON, RETICCTPCT in the last 72 hours. Urinalysis No results found for: COLORURINE, APPEARANCEUR, LABSPEC,  PHURINE, GLUCOSEU, HGBUR, BILIRUBINUR, KETONESUR, PROTEINUR, UROBILINOGEN, NITRITE, LEUKOCYTESUR Sepsis Labs Recent Labs  Lab 05/10/24 1500 05/11/24 0527 05/12/24 0057  WBC 7.7 6.5 7.4   Microbiology Recent Results (from the past 240 hours)  MRSA Next Gen by PCR, Nasal     Status: None   Collection Time: 05/11/24  1:27 PM   Specimen: Nasal Mucosa; Nasal Swab  Result Value Ref Range Status   MRSA by PCR Next Gen NOT DETECTED NOT DETECTED Final    Comment: (NOTE) The GeneXpert MRSA Assay (FDA approved for NASAL specimens only), is one component of a comprehensive MRSA colonization surveillance  program. It is not intended to diagnose MRSA infection nor to guide or monitor treatment for MRSA infections. Test performance is not FDA approved in patients less than 51 years old. Performed at [facility], [address] [place], [place] [address]      Total time spend on discharging this patient, including the last patient exam, discussing the hospital stay, instructions for ongoing care as it relates to all pertinent caregivers, as well as preparing the medical discharge records, prescriptions, and/or referrals as applicable, is 35 minutes.    Ellouise Haber, MD  [facility] 05/12/2024, 8:31 AM

## 2024-05-12 NOTE — Plan of Care (Signed)
 IV removed, discharge instructions reviewed, and patient will be discharged to home

## 2024-05-12 NOTE — Consult Note (Addendum)
 PHARMACY - ANTICOAGULATION CONSULT NOTE  Pharmacy Consult for Eliquis  Indication: pulmonary embolus  Allergies  Allergen Reactions   Aspirin     GI upset/Burns   Statins     myalgias   Patient Measurements: Height: 5' 6.5 (168.9 cm) Weight: 105.4 kg (232 lb 4.8 oz) IBW/kg (Calculated) : 60.45 HEPARIN  DW (KG): 84.5  Vital Signs: Temp: 97.4 F (36.3 C) (06/21 0747) Temp Source: Oral (06/21 0412) BP: 120/66 (06/21 0747) Pulse Rate: 67 (06/21 0747)  Labs: Recent Labs    05/10/24 1500 05/10/24 1607 05/10/24 1700 05/10/24 2319 05/11/24 0527 05/11/24 1144 05/12/24 0057 05/12/24 0652  HGB 12.7  --   --   --  12.5  --  11.8*  --   HCT 41.6  --   --   --  40.6  --  40.2  --   PLT 247  --   --   --  221  --  219  --   APTT  --  48*  --   --   --   --   --   --   LABPROT  --  18.2*  --   --   --   --   --   --   INR  --  1.5*  --   --   --   --   --   --   HEPARINUNFRC  --   --   --    < > 0.37 0.38 0.50 0.46  CREATININE 1.33*  --   --   --  1.00  --  0.97  --   TROPONINIHS 8  --  8  --   --   --   --   --    < > = values in this interval not displayed.   Estimated Creatinine Clearance: 69.7 mL/min (by C-G formula based on SCr of 0.97 mg/dL).  Medical History: Past Medical History:  Diagnosis Date   COPD (chronic obstructive pulmonary disease) (HCC)    Diabetes mellitus without complication (HCC)    DVT (deep venous thrombosis) (HCC)    Environmental allergies    H/O blood clots    Hypertension    Pulmonary embolism (HCC) 05/25/2012   [facility]   Medications:  Dabigatran 150mg  twice daily  Assessment: 68 y.o. female who presents to the emergency department today because of concern for pulmonary embolism seen on VQ scan performed yesterday.  Patient does have a history of blood clots. Patient admitted she takes dabigatran incorrectly at home, once daily vs the twice daily that she is prescribed. Pharmacy has been consulted to transition patient off of  heparin  drip on to apixaban .   Goal of Therapy:  Monitor platelets by anticoagulation protocol: Yes   Plan:  -- Discontinue heparin  infusion  -- Start apixaban  10 mg BID x 7 days, followed by 5 mg BID   Toris Laverdiere, PharmD Pharmacy Resident  05/12/2024 8:58 AM

## 2024-05-12 NOTE — Plan of Care (Signed)
  Problem: Fluid Volume: Goal: Ability to maintain a balanced intake and output will improve Outcome: Progressing   Problem: Education: Goal: Ability to describe self-care measures that may prevent or decrease complications (Diabetes Survival Skills Education) will improve Outcome: Progressing

## 2024-05-12 NOTE — Consult Note (Signed)
 PHARMACY - ANTICOAGULATION CONSULT NOTE  Pharmacy Consult for Heparin  Indication: pulmonary embolus  Allergies  Allergen Reactions   Aspirin     GI upset/Burns   Statins     myalgias    Patient Measurements: Height: 5' 6.5 (168.9 cm) Weight: 105.4 kg (232 lb 4.8 oz) IBW/kg (Calculated) : 60.45 HEPARIN  DW (KG): 84.5  Vital Signs: Temp: 97.7 F (36.5 C) (06/21 0412) Temp Source: Oral (06/21 0412) BP: 119/68 (06/21 0412) Pulse Rate: 72 (06/21 0412)  Labs: Recent Labs    05/10/24 1500 05/10/24 1607 05/10/24 1700 05/10/24 2319 05/11/24 0527 05/11/24 1144 05/12/24 0057 05/12/24 0652  HGB 12.7  --   --   --  12.5  --  11.8*  --   HCT 41.6  --   --   --  40.6  --  40.2  --   PLT 247  --   --   --  221  --  219  --   APTT  --  48*  --   --   --   --   --   --   LABPROT  --  18.2*  --   --   --   --   --   --   INR  --  1.5*  --   --   --   --   --   --   HEPARINUNFRC  --   --   --    < > 0.37 0.38 0.50 0.46  CREATININE 1.33*  --   --   --  1.00  --  0.97  --   TROPONINIHS 8  --  8  --   --   --   --   --    < > = values in this interval not displayed.    Estimated Creatinine Clearance: 69.7 mL/min (by C-G formula based on SCr of 0.97 mg/dL).   Medical History: Past Medical History:  Diagnosis Date   COPD (chronic obstructive pulmonary disease) (HCC)    Diabetes mellitus without complication (HCC)    DVT (deep venous thrombosis) (HCC)    Environmental allergies    H/O blood clots    Hypertension    Pulmonary embolism (HCC) 05/25/2012   [facility]    Medications:  Dabigatran 150mg  twice daily  Assessment: 68 y.o. female who presents to the emergency department today because of concern for pulmonary embolism seen on VQ scan performed yesterday.  Patient does have a history of blood clots. Patient admitted she takes dabigatran incorrectly at home, once daily vs the twice daily that she is prescribed. Pharmacy has been consulted to initiate and dose  continuous heparin  infusion.  Baseline labs: aPTT 48 sec, INR 1.5, Hgb 12.7, Plts 247  Date Time Results Comments 6/20 0527 HL 0.37 Therapeutic x 1 , rate 1400 units'hr 6/20 1144 HL 0.38 Therapeutic x 2 6/21     0057    HL 0.50           Therapeutic x 1 (post surgery)  6/21 0652 HL 0.46 Therapeutic x 2  Goal of Therapy:  Heparin  level 0.3-0.7 units/ml Monitor platelets by anticoagulation protocol: Yes   Plan:  - Will continue pt on current rate of 1700 units/hr - Will recheck tomorrow morning with AM labs - Continue to monitor H&H and platelets   Idolina DELENA Percy PharmD 05/12/2024 7:26 AM

## 2024-05-12 NOTE — Consult Note (Signed)
 PHARMACY - ANTICOAGULATION CONSULT NOTE  Pharmacy Consult for Heparin  Indication: pulmonary embolus  Allergies  Allergen Reactions   Aspirin     GI upset/Burns   Statins     myalgias    Patient Measurements: Height: 5' 6.5 (168.9 cm) Weight: 105.4 kg (232 lb 4.8 oz) IBW/kg (Calculated) : 60.45 HEPARIN  DW (KG): 84.5  Vital Signs: Temp: 98 F (36.7 C) (06/20 2345) Temp Source: Oral (06/20 2345) BP: 116/68 (06/20 2345) Pulse Rate: 72 (06/20 2345)  Labs: Recent Labs    05/10/24 1500 05/10/24 1607 05/10/24 1700 05/10/24 2319 05/11/24 0527 05/11/24 1144 05/12/24 0057  HGB 12.7  --   --   --  12.5  --  11.8*  HCT 41.6  --   --   --  40.6  --  40.2  PLT 247  --   --   --  221  --  219  APTT  --  48*  --   --   --   --   --   LABPROT  --  18.2*  --   --   --   --   --   INR  --  1.5*  --   --   --   --   --   HEPARINUNFRC  --   --   --    < > 0.37 0.38 0.50  CREATININE 1.33*  --   --   --  1.00  --   --   TROPONINIHS 8  --  8  --   --   --   --    < > = values in this interval not displayed.    Estimated Creatinine Clearance: 67.7 mL/min (by C-G formula based on SCr of 1 mg/dL).   Medical History: Past Medical History:  Diagnosis Date   COPD (chronic obstructive pulmonary disease) (HCC)    Diabetes mellitus without complication (HCC)    DVT (deep venous thrombosis) (HCC)    Environmental allergies    H/O blood clots    Hypertension    Pulmonary embolism (HCC) 05/25/2012   [facility]    Medications:  Dabigatran 150mg  twice daily  Assessment: 68 y.o. female who presents to the emergency department today because of concern for pulmonary embolism seen on VQ scan performed yesterday.  Patient does have a history of blood clots. Patient admitted she takes dabigatran incorrectly at home, once daily vs the twice daily that she is prescribed. Pharmacy has been consulted to initiate and dose continuous heparin  infusion.  Baseline labs: aPTT 48 sec, INR 1.5,  Hgb 12.7, Plts 247  Date Time Results Comments 6/20 0527 HL 0.37 Therapeutic x 1 , rate 1400 units'hr 6/20 1144 HL 0.38 Therapeutic x 2 6/21     0057    HL 0.50           Therapeutic X 1 (post surgery)   Goal of Therapy:  Heparin  level 0.3-0.7 units/ml Monitor platelets by anticoagulation protocol: Yes   Plan:  6/21:  HL @ 0057 = 0.50, therapeutic X 1 following restart - Will continue pt on current rate and recheck HL in 6 hrs  Continue to monitor H&H and platelets   Shylo Zamor D PharmD 05/12/2024 1:28 AM

## 2024-05-12 NOTE — Progress Notes (Signed)
 1 Day Post-Op   Subjective/Chief Complaint: States shortness of breath has improved. Otherwise without complaint.   Objective: Vital signs in last 24 hours: Temp:  [92.8 F (33.8 C)-98 F (36.7 C)] 97.4 F (36.3 C) (06/21 0747) Pulse Rate:  [0-90] 67 (06/21 0747) Resp:  [10-23] 19 (06/21 0412) BP: (72-131)/(51-79) 120/66 (06/21 0747) SpO2:  [84 %-100 %] 95 % (06/21 0747) Last BM Date : 05/10/24  Intake/Output from previous day: 06/20 0701 - 06/21 0700 In: 163.1 [I.V.:163.1] Out: -  Intake/Output this shift: No intake/output data recorded.  General appearance: alert and no distress Extremities: RIGHT groin access site- soft, no hematoma  Lab Results:  Recent Labs    05/11/24 0527 05/12/24 0057  WBC 6.5 7.4  HGB 12.5 11.8*  HCT 40.6 40.2  PLT 221 219   BMET Recent Labs    05/11/24 0527 05/12/24 0057  NA 141 138  K 3.9 4.3  CL 104 104  CO2 28 26  GLUCOSE 107* 121*  BUN 22 22  CREATININE 1.00 0.97  CALCIUM 8.5* 8.3*   PT/INR Recent Labs    05/10/24 1607  LABPROT 18.2*  INR 1.5*   ABG No results for input(s): PHART, HCO3 in the last 72 hours.  Invalid input(s): PCO2, PO2  Studies/Results: ECHOCARDIOGRAM COMPLETE Result Date: 05/11/2024    ECHOCARDIOGRAM REPORT   Patient Name:   [facility] Date of Exam: 05/11/2024 Medical Rec #:  [id number]      Height:       66.5 in Accession #:    [id number]     Weight:       232.3 lb Date of Birth:  1956-11-21     BSA:          2.143 m Patient Age:    67 years       BP:           108/62 mmHg Patient Gender: F              HR:           76 bpm. Exam Location:  [facility] Procedure: 2D Echo, Cardiac Doppler and Color Doppler (Both Spectral and Color            Flow Doppler were utilized during procedure). Indications:     Pulmonary embolus I26.09  History:         Patient has prior history of Echocardiogram examinations, most                  recent 04/11/2024. COPD; Risk Factors:Hypertension. DVT.  Sonographer:      Christopher Furnace Referring Phys:  [id number] KATY L FOUST Diagnosing Phys: Cara JONETTA Lovelace MD IMPRESSIONS  1. Left ventricular ejection fraction, by estimation, is 55 to 60%. The left ventricle has normal function. The left ventricle has no regional wall motion abnormalities. There is mild concentric left ventricular hypertrophy. Left ventricular diastolic parameters are consistent with Grade I diastolic dysfunction (impaired relaxation). There is the interventricular septum is flattened in systole and diastole, consistent with right ventricular pressure and volume overload.  2. Right ventricular systolic function is severely reduced. The right ventricular size is severely enlarged. Mildly increased right ventricular wall thickness.  3. The mitral valve is grossly normal. Mild mitral valve regurgitation.  4. Tricuspid valve regurgitation is mild to moderate.  5. The aortic valve is calcified. Aortic valve regurgitation is trivial. Aortic valve sclerosis/calcification is present, without any evidence of aortic stenosis. FINDINGS  Left Ventricle: Left ventricular ejection  fraction, by estimation, is 55 to 60%. The left ventricle has normal function. The left ventricle has no regional wall motion abnormalities. Strain was performed and the global longitudinal strain is indeterminate. Global longitudinal strain performed but not reported based on interpreter judgement due to suboptimal tracking. The left ventricular internal cavity size was normal in size. There is mild concentric left ventricular hypertrophy. The interventricular septum is flattened in systole and diastole, consistent with right ventricular pressure and volume overload. Left ventricular diastolic parameters are consistent with Grade I diastolic dysfunction (impaired relaxation). Right Ventricle: The right ventricular size is severely enlarged. Mildly increased right ventricular wall thickness. Right ventricular systolic function is severely reduced. Left  Atrium: Left atrial size was normal in size. Right Atrium: Right atrial size was normal in size. Pericardium: There is no evidence of pericardial effusion. Mitral Valve: The mitral valve is grossly normal. Mild mitral valve regurgitation. MV peak gradient, 4.8 mmHg. The mean mitral valve gradient is 2.0 mmHg. Tricuspid Valve: The tricuspid valve is grossly normal. Tricuspid valve regurgitation is mild to moderate. Aortic Valve: The aortic valve is calcified. Aortic valve regurgitation is trivial. Aortic valve sclerosis/calcification is present, without any evidence of aortic stenosis. Aortic valve mean gradient measures 2.0 mmHg. Aortic valve peak gradient measures 3.5 mmHg. Aortic valve area, by VTI measures 1.90 cm. Pulmonic Valve: The pulmonic valve was grossly normal. Pulmonic valve regurgitation is not visualized. Aorta: The ascending aorta was not well visualized. IAS/Shunts: No atrial level shunt detected by color flow Doppler. Additional Comments: 3D was performed not requiring image post processing on an independent workstation and was indeterminate.  LEFT VENTRICLE PLAX 2D LVIDd:         3.80 cm   Diastology LVIDs:         2.70 cm   LV e' medial:    4.46 cm/s LV PW:         1.00 cm   LV E/e' medial:  14.1 LV IVS:        1.30 cm   LV e' lateral:   8.27 cm/s LVOT diam:     2.00 cm   LV E/e' lateral: 7.6 LV SV:         34 LV SV Index:   16 LVOT Area:     3.14 cm  RIGHT VENTRICLE RV Basal diam:  5.30 cm RV Mid diam:    4.60 cm RV S prime:     11.20 cm/s TAPSE (M-mode): 2.3 cm LEFT ATRIUM           Index        RIGHT ATRIUM           Index LA diam:      2.60 cm 1.21 cm/m   RA Area:     30.60 cm LA Vol (A4C): 29.2 ml 13.62 ml/m  RA Volume:   120.00 ml 55.99 ml/m  AORTIC VALVE AV Area (Vmax):    2.31 cm AV Area (Vmean):   2.13 cm AV Area (VTI):     1.90 cm AV Vmax:           93.25 cm/s AV Vmean:          64.900 cm/s AV VTI:            0.180 m AV Peak Grad:      3.5 mmHg AV Mean Grad:      2.0 mmHg LVOT  Vmax:         68.70 cm/s LVOT Vmean:  44.000 cm/s LVOT VTI:          0.109 m LVOT/AV VTI ratio: 0.61  AORTA Ao Root diam: 2.90 cm MITRAL VALVE               TRICUSPID VALVE MV Area (PHT): 3.50 cm    TR Peak grad:   62.4 mmHg MV Area VTI:   1.25 cm    TR Vmax:        395.00 cm/s MV Peak grad:  4.8 mmHg MV Mean grad:  2.0 mmHg    SHUNTS MV Vmax:       1.09 m/s    Systemic VTI:  0.11 m MV Vmean:      65.3 cm/s   Systemic Diam: 2.00 cm MV Decel Time: 217 msec MV E velocity: 62.90 cm/s MV A velocity: 91.20 cm/s MV E/A ratio:  0.69 Dwayne D Callwood MD Electronically signed by Cara JONETTA Lovelace MD Signature Date/Time: 05/11/2024/6:33:27 PM    Final    PERIPHERAL VASCULAR CATHETERIZATION Result Date: 05/11/2024 See surgical note for result.  US  Venous Img Lower Bilateral (DVT) Result Date: 05/11/2024 CLINICAL DATA:  Pulmonary embolism, assess for DVT EXAM: BILATERAL LOWER EXTREMITY VENOUS DOPPLER ULTRASOUND TECHNIQUE: Gray-scale sonography with graded compression, as well as color Doppler and duplex ultrasound were performed to evaluate the lower extremity deep venous systems from the level of the common femoral vein and including the common femoral, femoral, profunda femoral, popliteal and calf veins including the posterior tibial, peroneal and gastrocnemius veins when visible. The superficial great saphenous vein was also interrogated. Spectral Doppler was utilized to evaluate flow at rest and with distal augmentation maneuvers in the common femoral, femoral and popliteal veins. COMPARISON:  None Available. FINDINGS: RIGHT LOWER EXTREMITY Common Femoral Vein: No evidence of thrombus. Normal compressibility, respiratory phasicity and response to augmentation. Saphenofemoral Junction: No evidence of thrombus. Normal compressibility and flow on color Doppler imaging. Profunda Femoral Vein: No evidence of thrombus. Normal compressibility and flow on color Doppler imaging. Femoral Vein: No evidence of thrombus.  Normal compressibility, respiratory phasicity and response to augmentation. Popliteal Vein: No evidence of thrombus. Normal compressibility, respiratory phasicity and response to augmentation. Calf Veins: No evidence of thrombus. Normal compressibility and flow on color Doppler imaging. Superficial Great Saphenous Vein: No evidence of thrombus. Normal compressibility. Venous Reflux:  None. Other Findings: Mildly complex fluid collection in the popliteal fossa measures 4.7 x 1.1 x 2.1 cm. LEFT LOWER EXTREMITY Common Femoral Vein: No evidence of thrombus. Normal compressibility, respiratory phasicity and response to augmentation. Saphenofemoral Junction: Thrombus present within the great saphenous vein extending into the saphenofemoral junction. Profunda Femoral Vein: No evidence of thrombus. Normal compressibility and flow on color Doppler imaging. Femoral Vein: Non occlusive thrombus present within the femoral vein in the mid and distal thigh. Popliteal Vein: Non occlusive eccentric thrombus present within the popliteal vein resulting in partial compressibility. Calf Veins: No evidence of thrombus. Normal compressibility and flow on color Doppler imaging. Superficial Great Saphenous Vein: No evidence of thrombus. Normal compressibility. Venous Reflux:  None. Other Findings:  None. IMPRESSION: 1. Positive for mild residual nonocclusive thrombus in the left femoral and popliteal veins. 2. No evidence of DVT in the right lower extremity. 3. Incidental note is made of a small right-sided Baker's cyst. Electronically Signed   By: Wilkie Lent M.D.   On: 05/11/2024 08:51   DG Chest 2 View Result Date: 05/10/2024 CLINICAL DATA:  Worsening shortness of breath. EXAM: CHEST - 2 VIEW COMPARISON:  May 09, 2024 FINDINGS: The heart size and mediastinal contours are within normal limits. Stable prominence of the bilateral hilar and perihilar pulmonary vasculature is seen. There is marked severity calcification of the  aortic arch. There is no evidence of acute infiltrate, pleural effusion or pneumothorax. Multilevel degenerative changes are seen throughout the thoracic spine with mild levoscoliosis of the visualized portion of the upper lumbar spine. IMPRESSION: Stable exam without active cardiopulmonary disease. Electronically Signed   By: Suzen Dials M.D.   On: 05/10/2024 16:08    Anti-infectives: Anti-infectives (From admission, onward)    Start     Dose/Rate Route Frequency Ordered Stop   05/11/24 0600  ceFAZolin  (ANCEF ) IVPB 2g/100 mL premix        2 g 200 mL/hr over 30 Minutes Intravenous On call to O.R. 05/10/24 1752 05/12/24 0908       Assessment/Plan: s/p Procedure(s): PULMONARY THROMBECTOMY (Bilateral) POD #1 Transition to Apixaban  OK from Vascular standpoint for discharge home.  LOS: 2 days    Tisa Curry LABOR 05/12/2024

## 2024-05-13 DIAGNOSIS — Z9981 Dependence on supplemental oxygen: Secondary | ICD-10-CM | POA: Diagnosis not present

## 2024-05-14 ENCOUNTER — Encounter: Payer: Self-pay | Admitting: Vascular Surgery

## 2024-05-14 MED ORDER — IODIXANOL 320 MG/ML IV SOLN
INTRAVENOUS | Status: AC | PRN
  Administered 2024-05-11: 55 mL

## 2024-05-18 ENCOUNTER — Telehealth: Payer: Self-pay | Admitting: Family

## 2024-05-18 NOTE — Telephone Encounter (Signed)
 Called to confirm/remind patient of their appointment at [facility] on 05/21/24.   Appointment:   [] Confirmed  [x] Left mess   [] No answer/No voice mail  [] VM Full/unable to leave message  [] Phone not in service  Patient reminded to bring all medications and/or complete list.  Confirmed patient has transportation. Gave directions, instructed to utilize valet parking.

## 2024-05-20 DIAGNOSIS — Z9981 Dependence on supplemental oxygen: Secondary | ICD-10-CM | POA: Diagnosis not present
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
## 2024-05-21 ENCOUNTER — Encounter: Payer: Self-pay | Admitting: Cardiology

## 2024-05-21 ENCOUNTER — Ambulatory Visit: Attending: Family | Admitting: Family

## 2024-05-21 ENCOUNTER — Ambulatory Visit: Attending: Cardiology | Admitting: Cardiology

## 2024-05-21 ENCOUNTER — Other Ambulatory Visit (HOSPITAL_COMMUNITY): Payer: Self-pay

## 2024-05-21 ENCOUNTER — Encounter: Payer: Self-pay | Admitting: Family

## 2024-05-21 VITALS — BP 114/73 | HR 76 | Wt 223.4 lb

## 2024-05-21 VITALS — BP 107/57 | HR 80 | Wt 222.7 lb

## 2024-05-21 DIAGNOSIS — I10 Essential (primary) hypertension: Secondary | ICD-10-CM | POA: Diagnosis not present

## 2024-05-21 DIAGNOSIS — I5032 Chronic diastolic (congestive) heart failure: Secondary | ICD-10-CM | POA: Diagnosis not present

## 2024-05-21 DIAGNOSIS — I110 Hypertensive heart disease with heart failure: Secondary | ICD-10-CM | POA: Insufficient documentation

## 2024-05-21 DIAGNOSIS — E782 Mixed hyperlipidemia: Secondary | ICD-10-CM | POA: Diagnosis not present

## 2024-05-21 DIAGNOSIS — R0609 Other forms of dyspnea: Secondary | ICD-10-CM

## 2024-05-21 DIAGNOSIS — J449 Chronic obstructive pulmonary disease, unspecified: Secondary | ICD-10-CM | POA: Diagnosis not present

## 2024-05-21 DIAGNOSIS — E119 Type 2 diabetes mellitus without complications: Secondary | ICD-10-CM | POA: Diagnosis not present

## 2024-05-21 DIAGNOSIS — G4733 Obstructive sleep apnea (adult) (pediatric): Secondary | ICD-10-CM | POA: Insufficient documentation

## 2024-05-21 DIAGNOSIS — I2699 Other pulmonary embolism without acute cor pulmonale: Secondary | ICD-10-CM

## 2024-05-21 DIAGNOSIS — E785 Hyperlipidemia, unspecified: Secondary | ICD-10-CM | POA: Insufficient documentation

## 2024-05-21 DIAGNOSIS — I2510 Atherosclerotic heart disease of native coronary artery without angina pectoris: Secondary | ICD-10-CM | POA: Insufficient documentation

## 2024-05-21 DIAGNOSIS — Z79899 Other long term (current) drug therapy: Secondary | ICD-10-CM | POA: Insufficient documentation

## 2024-05-21 DIAGNOSIS — Z87891 Personal history of nicotine dependence: Secondary | ICD-10-CM | POA: Diagnosis not present

## 2024-05-21 DIAGNOSIS — Z7984 Long term (current) use of oral hypoglycemic drugs: Secondary | ICD-10-CM | POA: Diagnosis not present

## 2024-05-21 DIAGNOSIS — I82512 Chronic embolism and thrombosis of left femoral vein: Secondary | ICD-10-CM | POA: Diagnosis not present

## 2024-05-21 DIAGNOSIS — Z9981 Dependence on supplemental oxygen: Secondary | ICD-10-CM | POA: Insufficient documentation

## 2024-05-21 DIAGNOSIS — Z7951 Long term (current) use of inhaled steroids: Secondary | ICD-10-CM | POA: Insufficient documentation

## 2024-05-21 DIAGNOSIS — Z7901 Long term (current) use of anticoagulants: Secondary | ICD-10-CM | POA: Insufficient documentation

## 2024-05-21 DIAGNOSIS — I519 Heart disease, unspecified: Secondary | ICD-10-CM | POA: Diagnosis not present

## 2024-05-21 DIAGNOSIS — I2782 Chronic pulmonary embolism: Secondary | ICD-10-CM | POA: Insufficient documentation

## 2024-05-21 DIAGNOSIS — Z91199 Patient's noncompliance with other medical treatment and regimen due to unspecified reason: Secondary | ICD-10-CM | POA: Insufficient documentation

## 2024-05-21 DIAGNOSIS — N939 Abnormal uterine and vaginal bleeding, unspecified: Secondary | ICD-10-CM | POA: Insufficient documentation

## 2024-05-21 MED ORDER — FARXIGA 10 MG PO TABS: 10.0000 mg | ORAL_TABLET | Freq: Every day | ORAL | 11 refills | Status: DC

## 2024-05-21 MED ORDER — TORSEMIDE 20 MG PO TABS: 20.0000 mg | ORAL_TABLET | Freq: Every day | ORAL | 3 refills | Status: DC

## 2024-05-21 MED ORDER — FARXIGA 10 MG PO TABS: 10.0000 mg | ORAL_TABLET | Freq: Every day | ORAL | Status: DC

## 2024-05-21 MED ORDER — LOSARTAN POTASSIUM 25 MG PO TABS
25.0000 mg | ORAL_TABLET | Freq: Every day | ORAL | Status: AC
Start: 2024-05-21 — End: ?

## 2024-05-21 MED ORDER — FARXIGA 10 MG PO TABS: 10.0000 mg | ORAL_TABLET | Freq: Every day | ORAL | 11 refills | Status: AC
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
## 2024-05-21 NOTE — Patient Instructions (Signed)
 Medication Instructions:  Your physician recommends the following medication changes.  START TAKING: Torsemide 20 mg daily  DECREASE: Losartan to 25 mg daily   *If you need a refill on your cardiac medications before your next appointment, please call your pharmacy*  Lab Work: Your provider would like for you to return in 10 days to have the following labs drawn: BMP.   Please go to [facility] [address] ([facility] [address], [place] [address] You do not need an appointment.  They are open from 8 am- 4:30 pm.  Lunch from 1:00 pm- 2:00 pm You do not need to be fasting.   If you have labs (blood work) drawn today and your tests are completely normal, you will receive your results only by: MyChart Message (if you have MyChart) OR A paper copy in the mail If you have any lab test that is abnormal or we need to change your treatment, we will call you to review the results.  Testing/Procedures: No test ordered today   Follow-Up: At [facility], you and your health needs are our priority.  As part of our continuing mission to provide you with exceptional heart care, our providers are all part of one team.  This team includes your primary Cardiologist (physician) and Advanced Practice Providers or APPs (Physician Assistants and Nurse Practitioners) who all work together to provide you with the care you need, when you need it.  Your next appointment:   6 week(s)  Provider:   You may see Dr. Darliss  We recommend signing up for the patient portal called MyChart.  Sign up information is provided on this After Visit Summary.  MyChart is used to connect with patients for Virtual Visits (Telemedicine).  Patients are able to view lab/test results, encounter notes, upcoming appointments, etc.  Non-urgent messages can be sent to your provider as well.   To learn more about what you can do with MyChart, go to [email or web address].

## 2024-05-21 NOTE — Patient Instructions (Addendum)
 Begin weighing daily and call for an overnight weight gain of 3 pounds or more or a weekly weight gain of more than 5 pounds.  Keep daily fluid intake to 60-64 ounces.   Medication Changes:  START Farxiga 10mg  (1 tab) daily   Follow-Up in: Please follow up with the [facility] Clinic in 2 weeks with Dr. Gardenia.  At the [facility], you and your health needs are our priority. We have a designated team specialized in the treatment of Heart Failure. This Care Team includes your primary Heart Failure Specialized Cardiologist (physician), Advanced Practice Providers (APPs- Physician Assistants and Nurse Practitioners), and Pharmacist who all work together to provide you with the care you need, when you need it.   You may see any of the following providers on your designated Care Team at your next follow up:  Dr. Toribio Fuel Dr. Ezra Shuck Dr. Ria Gardenia Dr. Odis Brownie Ellouise Class, FNP Jaun Bash, RPH-CPP  Please be sure to bring in all your medications bottles to every appointment.   Need to Contact Us :  If you have any questions or concerns before your next appointment please send us  a message through [place] or call our office at [phone].    TO LEAVE A MESSAGE FOR THE NURSE SELECT OPTION 2, PLEASE LEAVE A MESSAGE INCLUDING: YOUR NAME DATE OF BIRTH CALL BACK NUMBER REASON FOR CALL**this is important as we prioritize the call backs  YOU WILL RECEIVE A CALL BACK THE SAME DAY AS LONG AS YOU CALL BEFORE 4:00 PM

## 2024-05-24 DIAGNOSIS — J449 Chronic obstructive pulmonary disease, unspecified: Secondary | ICD-10-CM | POA: Diagnosis not present

## 2024-05-24 DIAGNOSIS — E876 Hypokalemia: Secondary | ICD-10-CM | POA: Diagnosis not present

## 2024-05-24 DIAGNOSIS — I10 Essential (primary) hypertension: Secondary | ICD-10-CM | POA: Diagnosis not present

## 2024-05-25 LAB — COMPREHENSIVE METABOLIC PANEL WITH GFR: EGFR: 45

## 2024-05-30 DIAGNOSIS — I82409 Acute embolism and thrombosis of unspecified deep veins of unspecified lower extremity: Secondary | ICD-10-CM | POA: Diagnosis not present

## 2024-05-30 DIAGNOSIS — J449 Chronic obstructive pulmonary disease, unspecified: Secondary | ICD-10-CM | POA: Diagnosis not present

## 2024-05-30 DIAGNOSIS — I2699 Other pulmonary embolism without acute cor pulmonale: Secondary | ICD-10-CM | POA: Diagnosis not present

## 2024-05-30 DIAGNOSIS — R609 Edema, unspecified: Secondary | ICD-10-CM | POA: Diagnosis not present

## 2024-06-05 ENCOUNTER — Encounter: Admitting: Cardiology

## 2024-06-08 ENCOUNTER — Ambulatory Visit: Admitting: Cardiology

## 2024-06-12 DIAGNOSIS — Z9981 Dependence on supplemental oxygen: Secondary | ICD-10-CM | POA: Diagnosis not present

## 2024-07-13 DIAGNOSIS — Z9981 Dependence on supplemental oxygen: Secondary | ICD-10-CM | POA: Diagnosis not present

## 2024-07-16 ENCOUNTER — Telehealth: Payer: Self-pay

## 2024-07-16 ENCOUNTER — Telehealth: Payer: Self-pay | Admitting: Family
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
## 2024-07-16 NOTE — Progress Notes (Signed)
 Attempted to call pt to move appt up per Ellouise Class, FNP. Lvm to return call.

## 2024-07-16 NOTE — Telephone Encounter (Signed)
 Called to confirm/remind patient of their appointment at the [facility] on 07/17/24.   Appointment:   [] Confirmed  [x] Left mess   [] No answer/No voice mail  [] VM Full/unable to leave message  [] Phone not in service  Patient reminded to bring all medications and/or complete list.  Confirmed patient has transportation. Gave directions, instructed to utilize valet parking.

## 2024-07-17 ENCOUNTER — Encounter: Admitting: Family

## 2024-07-17 ENCOUNTER — Ambulatory Visit: Attending: Cardiology | Admitting: Cardiology

## 2024-07-17 ENCOUNTER — Encounter: Payer: Self-pay | Admitting: Cardiology

## 2024-07-17 VITALS — BP 90/60 | HR 70 | Ht 63.0 in | Wt 225.2 lb

## 2024-07-17 DIAGNOSIS — R0689 Other abnormalities of breathing: Secondary | ICD-10-CM | POA: Diagnosis not present

## 2024-07-17 DIAGNOSIS — R0609 Other forms of dyspnea: Secondary | ICD-10-CM | POA: Diagnosis not present

## 2024-07-17 DIAGNOSIS — E878 Other disorders of electrolyte and fluid balance, not elsewhere classified: Secondary | ICD-10-CM | POA: Diagnosis not present

## 2024-07-17 DIAGNOSIS — I10 Essential (primary) hypertension: Secondary | ICD-10-CM | POA: Diagnosis not present

## 2024-07-17 DIAGNOSIS — I501 Left ventricular failure: Secondary | ICD-10-CM | POA: Diagnosis not present

## 2024-07-17 DIAGNOSIS — I519 Heart disease, unspecified: Secondary | ICD-10-CM | POA: Diagnosis not present

## 2024-07-17 DIAGNOSIS — I2510 Atherosclerotic heart disease of native coronary artery without angina pectoris: Secondary | ICD-10-CM | POA: Diagnosis not present

## 2024-07-17 DIAGNOSIS — R0600 Dyspnea, unspecified: Secondary | ICD-10-CM | POA: Diagnosis not present

## 2024-07-17 NOTE — Patient Instructions (Addendum)
 Medication Instructions:  Your physician has recommended you make the following change in your medication:   Please call with instructions on your Bumex.   *If you need a refill on your cardiac medications before your next appointment, please call your pharmacy*  Lab Work: BMP today  If you have labs (blood work) drawn today and your tests are completely normal, you will receive your results only by: MyChart Message (if you have MyChart) OR A paper copy in the mail If you have any lab test that is abnormal or we need to change your treatment, we will call you to review the results.  Testing/Procedures: None  Follow-Up: At [facility], you and your health needs are our priority.  As part of our continuing mission to provide you with exceptional heart care, our providers are all part of one team.  This team includes your primary Cardiologist (physician) and Advanced Practice Providers or APPs (Physician Assistants and Nurse Practitioners) who all work together to provide you with the care you need, when you need it.  Your next appointment:   3 month(s)  Provider:   Redell Cave, MD    We recommend signing up for the patient portal called MyChart.  Sign up information is provided on this After Visit Summary.  MyChart is used to connect with patients for Virtual Visits (Telemedicine).  Patients are able to view lab/test results, encounter notes, upcoming appointments, etc.  Non-urgent messages can be sent to your provider as well.   To learn more about what you can do with MyChart, go to [email or web address].   Other Instructions Appointment with the Heart Failure Clinic is  08/02/24 at 11:15 am with Dr. Sabharwal
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
## 2024-07-18 ENCOUNTER — Ambulatory Visit: Payer: Self-pay | Admitting: Cardiology

## 2024-07-18 LAB — BASIC METABOLIC PANEL WITH GFR
BUN/Creatinine Ratio: 15 (ref 12–28)
BUN: 24 mg/dL (ref 8–27)
CO2: 24 mmol/L (ref 20–29)
Calcium: 9.7 mg/dL (ref 8.7–10.3)
Chloride: 97 mmol/L (ref 96–106)
Creatinine, Ser: 1.55 mg/dL — ABNORMAL HIGH (ref 0.57–1.00)
Glucose: 68 mg/dL — ABNORMAL LOW (ref 70–99)
Potassium: 4.5 mmol/L (ref 3.5–5.2)
Sodium: 137 mmol/L (ref 134–144)
eGFR: 36 mL/min/1.73 — ABNORMAL LOW (ref >59)

## 2024-07-20 ENCOUNTER — Telehealth: Payer: Self-pay | Admitting: Cardiology

## 2024-07-20 NOTE — Telephone Encounter (Signed)
 Attempted to call patient back.  No answer.  Unable to leave message due to mailbox being full.

## 2024-07-20 NOTE — Telephone Encounter (Signed)
 Patient called to inform provider she is taking Bumetanide 1 mg/ 2-3x a week.

## 2024-07-26 ENCOUNTER — Other Ambulatory Visit: Payer: Self-pay

## 2024-08-02 ENCOUNTER — Encounter: Admitting: Cardiology

## 2024-08-10 ENCOUNTER — Encounter: Admitting: Cardiology

## 2024-08-30 DIAGNOSIS — E1129 Type 2 diabetes mellitus with other diabetic kidney complication: Secondary | ICD-10-CM | POA: Diagnosis not present

## 2024-08-30 DIAGNOSIS — J9611 Chronic respiratory failure with hypoxia: Secondary | ICD-10-CM | POA: Diagnosis not present

## 2024-08-30 DIAGNOSIS — J449 Chronic obstructive pulmonary disease, unspecified: Secondary | ICD-10-CM | POA: Diagnosis not present

## 2024-08-30 DIAGNOSIS — R809 Proteinuria, unspecified: Secondary | ICD-10-CM | POA: Diagnosis not present

## 2024-08-30 LAB — HEMOGLOBIN A1C: A1c: 5.7

## 2024-09-06 ENCOUNTER — Encounter: Admitting: Cardiology

## 2024-09-12 DIAGNOSIS — J9611 Chronic respiratory failure with hypoxia: Secondary | ICD-10-CM | POA: Diagnosis not present

## 2024-10-01 ENCOUNTER — Telehealth: Payer: Self-pay | Admitting: Family
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
## 2024-10-01 NOTE — Telephone Encounter (Signed)
 Called to confirm/remind patient of their appointment at the [facility] on 10/02/24.   Appointment:   [x] Confirmed  [] Left mess   [] No answer/No voice mail  [] VM Full/unable to leave message  [] Phone not in service  Patient reminded to bring all medications and/or complete list.  Confirmed patient has transportation. Gave directions, instructed to utilize valet parking.

## 2024-10-02 ENCOUNTER — Encounter: Admitting: Cardiology

## 2024-10-02 ENCOUNTER — Ambulatory Visit: Admitting: Family

## 2024-10-13 DIAGNOSIS — J9611 Chronic respiratory failure with hypoxia: Secondary | ICD-10-CM | POA: Diagnosis not present

## 2024-10-25 ENCOUNTER — Ambulatory Visit: Admitting: Cardiology
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
## 2024-12-18 ENCOUNTER — Ambulatory Visit: Admitting: Family

## 2025-01-02 ENCOUNTER — Ambulatory Visit: Admitting: Family

## 2025-01-07 ENCOUNTER — Ambulatory Visit: Admitting: Family

## 2025-01-07 ENCOUNTER — Encounter (INDEPENDENT_AMBULATORY_CARE_PROVIDER_SITE_OTHER): Admitting: Nurse Practitioner

## 2025-01-10 ENCOUNTER — Ambulatory Visit: Admitting: Cardiology
